# Patient Record
Sex: Male | Born: 1945 | Race: White | Hispanic: No | Smoking: Heavy tobacco smoker
Health system: Southern US, Community
[De-identification: ages and names within clinical notes are randomized; demographics above are authoritative.]

## PROBLEM LIST (undated history)

## (undated) DIAGNOSIS — IMO0001 Reserved for inherently not codable concepts without codable children: Secondary | ICD-10-CM

## (undated) DIAGNOSIS — J449 Chronic obstructive pulmonary disease, unspecified: Secondary | ICD-10-CM

## (undated) DIAGNOSIS — Z5111 Encounter for antineoplastic chemotherapy: Secondary | ICD-10-CM

## (undated) DIAGNOSIS — F329 Major depressive disorder, single episode, unspecified: Secondary | ICD-10-CM

## (undated) DIAGNOSIS — F32A Depression, unspecified: Secondary | ICD-10-CM

## (undated) DIAGNOSIS — I10 Essential (primary) hypertension: Secondary | ICD-10-CM

## (undated) DIAGNOSIS — R911 Solitary pulmonary nodule: Secondary | ICD-10-CM

## (undated) DIAGNOSIS — F419 Anxiety disorder, unspecified: Secondary | ICD-10-CM

## (undated) DIAGNOSIS — H269 Unspecified cataract: Secondary | ICD-10-CM

## (undated) HISTORY — DX: Major depressive disorder, single episode, unspecified: F32.9

## (undated) HISTORY — PX: APPENDECTOMY: SHX54

## (undated) HISTORY — DX: Chronic obstructive pulmonary disease, unspecified: J44.9

## (undated) HISTORY — PX: EYE SURGERY: SHX253

## (undated) HISTORY — DX: Depression, unspecified: F32.A

## (undated) HISTORY — DX: Essential (primary) hypertension: I10

## (undated) HISTORY — DX: Encounter for antineoplastic chemotherapy: Z51.11

---

## 2015-08-08 ENCOUNTER — Other Ambulatory Visit (HOSPITAL_COMMUNITY): Payer: Self-pay | Admitting: Nurse Practitioner

## 2015-08-08 DIAGNOSIS — J449 Chronic obstructive pulmonary disease, unspecified: Secondary | ICD-10-CM

## 2015-08-15 ENCOUNTER — Ambulatory Visit (HOSPITAL_COMMUNITY): Admission: RE | Admit: 2015-08-15 | Payer: Medicare PPO | Source: Ambulatory Visit

## 2015-08-22 ENCOUNTER — Ambulatory Visit (HOSPITAL_COMMUNITY): Payer: Medicare PPO

## 2015-08-30 ENCOUNTER — Other Ambulatory Visit (HOSPITAL_COMMUNITY): Payer: Self-pay | Admitting: Nurse Practitioner

## 2015-08-30 DIAGNOSIS — J449 Chronic obstructive pulmonary disease, unspecified: Secondary | ICD-10-CM

## 2015-08-31 ENCOUNTER — Encounter (HOSPITAL_COMMUNITY): Payer: Self-pay

## 2015-08-31 ENCOUNTER — Ambulatory Visit (HOSPITAL_COMMUNITY)
Admission: RE | Admit: 2015-08-31 | Discharge: 2015-08-31 | Disposition: A | Discharge status: Home / Self Care | Payer: Medicare PPO | Source: Ambulatory Visit | Attending: Nurse Practitioner | Admitting: Nurse Practitioner

## 2015-08-31 DIAGNOSIS — R911 Solitary pulmonary nodule: Secondary | ICD-10-CM | POA: Diagnosis not present

## 2015-08-31 DIAGNOSIS — J439 Emphysema, unspecified: Secondary | ICD-10-CM | POA: Insufficient documentation

## 2015-08-31 DIAGNOSIS — J449 Chronic obstructive pulmonary disease, unspecified: Secondary | ICD-10-CM | POA: Insufficient documentation

## 2015-09-05 ENCOUNTER — Telehealth: Payer: Self-pay | Admitting: *Deleted

## 2015-09-05 NOTE — Telephone Encounter (Signed)
Oncology Nurse Navigator Documentation  Oncology Nurse Navigator Flowsheets 09/05/2015  Navigator Encounter Type Telephone  Telephone Outgoing Call  Abnormal Finding Date 08/31/2015  Treatment Phase Pre-Tx/Tx Discussion  Barriers/Navigation Needs Coordination of Care  Interventions Coordination of Care  Coordination of Care Appts  Acuity Level 1  Acuity Level 1 Initial guidance, education and coordination as needed  Time Spent with Patient 15   I received referral on [facility] today.  I called patient but was unable to reach or leave a message.  I will call back.  I also asked HIM dept to obtain more records.

## 2015-09-08 ENCOUNTER — Encounter: Payer: Self-pay | Admitting: *Deleted

## 2015-09-08 ENCOUNTER — Telehealth: Payer: Self-pay | Admitting: *Deleted

## 2015-09-08 DIAGNOSIS — R918 Other nonspecific abnormal finding of lung field: Secondary | ICD-10-CM | POA: Insufficient documentation

## 2015-09-08 NOTE — Telephone Encounter (Signed)
Oncology Nurse Navigator Documentation  Oncology Nurse Navigator Flowsheets 09/08/2015  Navigator Encounter Type Telephone  Telephone Outgoing Call  Abnormal Finding Date -  Treatment Phase Pre-Tx/Tx Discussion  Barriers/Navigation Needs Coordination of Care  Interventions Coordination of Care  Coordination of Care EUS  Acuity Level 2  Acuity Level 1 -  Time Spent with Patient 58   I received referral and was able to reach [facility] today.  I gave the transportation dept information on appt to see Dr. Julien Nordmann on 09/13/15 arrive at 1:45.  I called PCP for records

## 2015-09-08 NOTE — Progress Notes (Signed)
Oncology Nurse Navigator Documentation  Oncology Nurse Navigator Flowsheets 09/08/2015  Navigator Encounter Type Telephone;Other  Telephone Outgoing Call  Abnormal Finding Date -  Treatment Phase Pre-Tx/Tx Discussion  Barriers/Navigation Needs Coordination of Care  Interventions Coordination of Care  Coordination of Care Other  Acuity Level 2  Time Spent with Patient 15   Called referring office twice today to obtain records.  I was unable to reach nor leave a vm message.

## 2015-09-09 ENCOUNTER — Telehealth: Payer: Self-pay | Admitting: Internal Medicine

## 2015-09-09 ENCOUNTER — Encounter: Payer: Self-pay | Admitting: *Deleted

## 2015-09-09 NOTE — Telephone Encounter (Signed)
Wells Guiles from nursing home will obtain authorization for pt

## 2015-09-09 NOTE — Progress Notes (Signed)
Oncology Nurse Navigator Documentation  Oncology Nurse Navigator Flowsheets 09/09/2015  Navigator Encounter Type Telephone  Telephone Outgoing Call  Treatment Phase Pre-Tx/Tx Discussion  Barriers/Navigation Needs Coordination of Care  Interventions Coordination of Care  Coordination of Care Other  Acuity Level 2  Time Spent with Patient 15   I called referring office today.  I called several phone numbers but have not been able to reach office.  I am unable to leave a vm message.

## 2015-09-13 ENCOUNTER — Ambulatory Visit (HOSPITAL_BASED_OUTPATIENT_CLINIC_OR_DEPARTMENT_OTHER): Payer: Medicare PPO | Admitting: Internal Medicine

## 2015-09-13 ENCOUNTER — Other Ambulatory Visit (HOSPITAL_BASED_OUTPATIENT_CLINIC_OR_DEPARTMENT_OTHER): Payer: Medicare PPO

## 2015-09-13 ENCOUNTER — Encounter: Payer: Self-pay | Admitting: Internal Medicine

## 2015-09-13 ENCOUNTER — Telehealth: Payer: Self-pay | Admitting: Internal Medicine

## 2015-09-13 VITALS — BP 111/67 | HR 80 | Temp 98.6°F | Resp 18 | Ht 68.0 in | Wt 175.7 lb

## 2015-09-13 DIAGNOSIS — Z720 Tobacco use: Secondary | ICD-10-CM | POA: Diagnosis not present

## 2015-09-13 DIAGNOSIS — R0609 Other forms of dyspnea: Secondary | ICD-10-CM | POA: Diagnosis not present

## 2015-09-13 DIAGNOSIS — R918 Other nonspecific abnormal finding of lung field: Secondary | ICD-10-CM

## 2015-09-13 DIAGNOSIS — Z8042 Family history of malignant neoplasm of prostate: Secondary | ICD-10-CM

## 2015-09-13 DIAGNOSIS — R05 Cough: Secondary | ICD-10-CM | POA: Diagnosis not present

## 2015-09-13 LAB — CBC WITH DIFFERENTIAL/PLATELET
BASO%: 0.5 % (ref 0.0–2.0)
BASOS ABS: 0 10*3/uL (ref 0.0–0.1)
EOS%: 2 % (ref 0.0–7.0)
Eosinophils Absolute: 0.2 10*3/uL (ref 0.0–0.5)
HCT: 45.5 % (ref 38.4–49.9)
HGB: 15.7 g/dL (ref 13.0–17.1)
LYMPH%: 13 % — AB (ref 14.0–49.0)
MCH: 29.7 pg (ref 27.2–33.4)
MCHC: 34.5 g/dL (ref 32.0–36.0)
MCV: 86 fL (ref 79.3–98.0)
MONO#: 0.4 10*3/uL (ref 0.1–0.9)
MONO%: 4.8 % (ref 0.0–14.0)
NEUT#: 6.4 10*3/uL (ref 1.5–6.5)
NEUT%: 79.7 % — AB (ref 39.0–75.0)
Platelets: 200 10*3/uL (ref 140–400)
RBC: 5.29 10*6/uL (ref 4.20–5.82)
RDW: 14.6 % (ref 11.0–14.6)
WBC: 8.1 10*3/uL (ref 4.0–10.3)
lymph#: 1.1 10*3/uL (ref 0.9–3.3)
nRBC: 0 % (ref 0–0)

## 2015-09-13 LAB — COMPREHENSIVE METABOLIC PANEL
ALK PHOS: 83 U/L (ref 40–150)
ALT: 12 U/L (ref 0–55)
ANION GAP: 8 meq/L (ref 3–11)
AST: 10 U/L (ref 5–34)
Albumin: 3.5 g/dL (ref 3.5–5.0)
BUN: 13.1 mg/dL (ref 7.0–26.0)
CO2: 29 meq/L (ref 22–29)
Calcium: 9.3 mg/dL (ref 8.4–10.4)
Chloride: 101 mEq/L (ref 98–109)
Creatinine: 1.4 mg/dL — ABNORMAL HIGH (ref 0.7–1.3)
EGFR: 53 mL/min/{1.73_m2} — AB (ref >90)
GLUCOSE: 125 mg/dL (ref 70–140)
POTASSIUM: 4.7 meq/L (ref 3.5–5.1)
SODIUM: 137 meq/L (ref 136–145)
Total Bilirubin: 0.49 mg/dL (ref 0.20–1.20)
Total Protein: 6.8 g/dL (ref 6.4–8.3)

## 2015-09-13 NOTE — Telephone Encounter (Signed)
Gave pt appt & avs °

## 2015-09-13 NOTE — Progress Notes (Signed)
[facility] [phone]   [phone]  CONSULT NOTE  REFERRING PHYSICIAN: Shelba Flake, NP   REASON FOR CONSULTATION:  70 years old white male with questionable lung cancer.  HPI [facility] is a 70 y.o. male with past medical history significant for COPD, depression and hypertension as well as long history of smoking. The patient is currently a resident of his skilled nursing facility for unclear reason. He was seen at one of the urgent care centers recently complaining of shortness of breath and cough. Chest x-ray at that time showed questionable nodule in the left lung. This was followed by CT scan of the chest without contrast on 08/31/2015 and it showed a spiculated nodule measuring 2.5 x 2.8 x 2.8 cm which extend and crosses through the oblique fissure. There was also a prevascular lymph node adjacent to the transverse or aortic measuring 11 mm and a subcarinal lymph node measuring 11 mm. No axillary or supraclavicular adenopathy. There was also small low density structure within the left lobe of the liver which is too small to characterize measuring 7 mm. The patient was referred to me today for further evaluation and recommendation regarding these abnormalities in his lung. When seen today he is feeling fine with no specific complaints except for shortness breath at baseline and increased with exertion and dry cough. He denied having any significant chest pain or hemoptysis. He has no nausea, vomiting or diarrhea but has constipation. He also has intermittent headache and blurry vision. The patient denied having any significant weight loss or night sweats. Family history significant for father with prostate cancer and mother had COPD. The patient is a widow and has 4 children. He lives by himself in [place] and he has no connection with his children. He was accompanied by a friend, [place]. He used to work in [employer]. He has a history of smoking  1-2 packs per day for around 53 years and he currently smoke one cigarette every day. He denied any history of alcohol or drug abuse.    HPI  Past Medical History  Diagnosis Date  . HTN (hypertension)   . COPD (chronic obstructive pulmonary disease) ([place])   . Depression     History reviewed. No pertinent past surgical history.  Family History  Problem Relation Age of Onset  . COPD Mother   . Cancer Father     Social History Social History  Substance Use Topics  . Smoking status: Heavy Tobacco Smoker -- 2.00 packs/day for 15 years    Types: Cigarettes  . Smokeless tobacco: None  . Alcohol Use: No    Not on File  Current Outpatient Prescriptions  Medication Sig Dispense Refill  . acetaminophen (TYLENOL) 325 MG tablet Take 650 mg by mouth.    Marland Kitchen albuterol (PROAIR HFA) 108 (90 Base) MCG/ACT inhaler Inhale 2 puffs into the lungs every 4 (four) hours as needed.    Marland Kitchen amLODipine (NORVASC) 5 MG tablet Take 5 mg by mouth.    Marland Kitchen buPROPion (WELLBUTRIN SR) 150 MG 12 hr tablet Take 150 mg by mouth.    . hydrOXYzine (ATARAX/VISTARIL) 25 MG tablet Take 25 mg by mouth.    . risperiDONE (RISPERDAL) 2 MG tablet Take 2 mg by mouth.    . traZODone (DESYREL) 150 MG tablet Take 150 mg by mouth.     No current facility-administered medications for this visit.    Review of Systems  Constitutional: positive for fatigue Eyes: negative Ears, nose,  mouth, throat, and face: negative Respiratory: positive for cough, dyspnea on exertion and wheezing Cardiovascular: negative Gastrointestinal: negative Genitourinary:negative Integument/breast: negative Hematologic/lymphatic: negative Musculoskeletal:negative Neurological: negative Behavioral/Psych: negative Endocrine: negative Allergic/Immunologic: negative  Physical Exam  VOH:YWVPX, healthy, no distress, well nourished and well developed SKIN: skin color, texture, turgor are normal, no rashes or significant lesions HEAD: Normocephalic,  No masses, lesions, tenderness or abnormalities EYES: normal, PERRLA, Conjunctiva are pink and non-injected EARS: External ears normal, Canals clear OROPHARYNX:no exudate, no erythema and lips, buccal mucosa, and tongue normal  NECK: supple, no adenopathy, no JVD LYMPH:  no palpable lymphadenopathy, no hepatosplenomegaly LUNGS: prolonged expiratory phase, expiratory wheezes bilaterally HEART: regular rate & rhythm, no murmurs and no gallops ABDOMEN:abdomen soft, non-tender, normal bowel sounds and no masses or organomegaly BACK: Back symmetric, no curvature., No CVA tenderness EXTREMITIES:no joint deformities, effusion, or inflammation, no edema, no skin discoloration  NEURO: alert & oriented x 3 with fluent speech, no focal motor/sensory deficits  PERFORMANCE STATUS: ECOG   LABORATORY DATA: Lab Results  Component Value Date   WBC 8.1 09/13/2015   HGB 15.7 09/13/2015   HCT 45.5 09/13/2015   MCV 86.0 09/13/2015   PLT 200 09/13/2015      Chemistry      Component Value Date/Time   NA 137 09/13/2015 1340   K 4.7 09/13/2015 1340   CO2 29 09/13/2015 1340   BUN 13.1 09/13/2015 1340   CREATININE 1.4* 09/13/2015 1340      Component Value Date/Time   CALCIUM 9.3 09/13/2015 1340   ALKPHOS 83 09/13/2015 1340   AST 10 09/13/2015 1340   ALT 12 09/13/2015 1340   BILITOT 0.49 09/13/2015 1340       RADIOGRAPHIC STUDIES: Ct Chest Wo Contrast  08/31/2015  CLINICAL DATA:  Increased shortness of breath for 6 months. EXAM: CT CHEST WITHOUT CONTRAST TECHNIQUE: Multidetector CT imaging of the chest was performed following the standard protocol without IV contrast. COMPARISON:  None. FINDINGS: Mediastinum/Lymph Nodes: Heart size is normal. Aortic atherosclerosis noted. Calcification within the RCA, LAD and left circumflex coronary artery noted. There is a prevascular lymph node adjacent to the transverse or aortic arch measuring 11 mm, image 36 of series 2. Sub- carinal lymph node is prominent  measuring 11 mm, image 45 of series 2. No axillary or supraclavicular adenopathy. Lungs/Pleura: No pleural fluid. Moderate changes of centrilobular and paraseptal emphysema identified. Diffuse bronchial wall thickening noted. Within the superior segment of the left lower lobe there is a spiculated nodule which extends and crosses through the oblique fissure, image 58 of series 5. This measures 2.5 x 2.8 by 2.8 cm. There are dependent changes identified within the right middle lobe and right lower lobe. Small nonspecific subpleural nodule within the posterior left apex measures 8 mm. Upper abdomen: The adrenal glands are normal. There is a small low density structure within the left lobe of liver which is too small to characterize measuring 7 mm. No acute findings identified within the upper abdomen. Musculoskeletal: No aggressive lytic or sclerotic bone lesions identified. IMPRESSION: 1. Spiculated nodule in the superior segment of the left lower lobe is highly worrisome for primary bronchogenic carcinoma. Further evaluation with PET-CT and tissue sampling (if indicated) is recommended. 2. Diffuse bronchial wall thickening with emphysema, as above; imaging findings suggestive of underlying COPD. 3. These results will be called to the ordering clinician or representative by the Radiologist Assistant, and communication documented in the PACS or zVision Dashboard. Electronically Signed   By: Lovena Le  Clovis Riley M.D.   On: 08/31/2015 16:32    ASSESSMENT: This is a very pleasant 70 years old white male with left lower lobe lung nodule in addition to suspicious mediastinal lymphadenopathy concerning for primary bronchogenic carcinoma.   PLAN: I had a lengthy discussion with the patient and his caregiver today about his current condition and further investigation to confirm diagnosis. This is highly suspicious for his stage III lung cancer. I recommended for the patient to have a PET scan performed for further evaluation  of his disease. If the PET scan showed no evidence for metastatic disease in the mediastinum or outside the chest, I would consider referring the patient to cardiothoracic surgery for evaluation of surgical resection after repeating pulmonary function test. If the mediastinal lymph nodes are concerning for metastatic disease, which may consider the patient for CT-guided core biopsy of the left lower lobe lung mass. The patient would come back for follow-up visit in 2 weeks for further evaluation and recommendation regarding his condition based on the PET scan results. He was advised to call immediately if he has any concerning symptoms in the interval.  The patient voices understanding of current disease status and treatment options and is in agreement with the current care plan.  All questions were answered. The patient knows to call the clinic with any problems, questions or concerns. We can certainly see the patient much sooner if necessary.  Thank you so much for allowing me to participate in the care of [facility]. I will continue to follow up the patient with you and assist in his care.  I spent 40 minutes counseling the patient face to face. The total time spent in the appointment was 60 minutes.  Disclaimer: This note was dictated with voice recognition software. Similar sounding words can inadvertently be transcribed and may not be corrected upon review.   Tearah Saulsbury K. September 13, 2015, 3:23 PM

## 2015-09-20 ENCOUNTER — Encounter (HOSPITAL_COMMUNITY): Payer: Medicare PPO

## 2015-09-26 ENCOUNTER — Ambulatory Visit (HOSPITAL_COMMUNITY)
Admission: RE | Admit: 2015-09-26 | Discharge: 2015-09-26 | Disposition: A | Discharge status: Home / Self Care | Payer: Medicare PPO | Source: Ambulatory Visit | Attending: Internal Medicine | Admitting: Internal Medicine

## 2015-09-26 DIAGNOSIS — R911 Solitary pulmonary nodule: Secondary | ICD-10-CM | POA: Insufficient documentation

## 2015-09-26 DIAGNOSIS — R590 Localized enlarged lymph nodes: Secondary | ICD-10-CM | POA: Insufficient documentation

## 2015-09-26 DIAGNOSIS — J850 Gangrene and necrosis of lung: Secondary | ICD-10-CM | POA: Diagnosis not present

## 2015-09-26 DIAGNOSIS — R918 Other nonspecific abnormal finding of lung field: Secondary | ICD-10-CM | POA: Insufficient documentation

## 2015-09-26 LAB — GLUCOSE, CAPILLARY: GLUCOSE-CAPILLARY: 69 mg/dL (ref 65–99)

## 2015-09-26 MED ORDER — FLUDEOXYGLUCOSE F - 18 (FDG) INJECTION
9.2000 | Freq: Once | INTRAVENOUS | Status: AC | PRN
  Administered 2015-09-26: 9.2 via INTRAVENOUS

## 2015-09-29 ENCOUNTER — Encounter: Payer: Self-pay | Admitting: Internal Medicine

## 2015-09-29 ENCOUNTER — Encounter: Payer: Self-pay | Admitting: *Deleted

## 2015-09-29 ENCOUNTER — Telehealth: Payer: Self-pay | Admitting: Internal Medicine

## 2015-09-29 ENCOUNTER — Ambulatory Visit (HOSPITAL_BASED_OUTPATIENT_CLINIC_OR_DEPARTMENT_OTHER): Payer: Medicare PPO | Admitting: Internal Medicine

## 2015-09-29 VITALS — BP 126/53 | HR 78 | Temp 97.8°F | Resp 18 | Ht 68.0 in | Wt 178.7 lb

## 2015-09-29 DIAGNOSIS — R918 Other nonspecific abnormal finding of lung field: Secondary | ICD-10-CM | POA: Diagnosis not present

## 2015-09-29 DIAGNOSIS — R109 Unspecified abdominal pain: Secondary | ICD-10-CM | POA: Diagnosis not present

## 2015-09-29 NOTE — Progress Notes (Signed)
Oncology Nurse Navigator Documentation  Oncology Nurse Navigator Flowsheets 09/29/2015  Navigator Encounter Type Clinic/MDC  Patient Visit Type MedOnc  Treatment Phase Pre-Tx/Tx Discussion  Barriers/Navigation Needs Coordination of Care  Interventions Coordination of Care  Coordination of Care Appts  Acuity Level 2  Time Spent with Patient 80   Spoke with patient and friend today at [facility].  He needs assistance with arranging appt's.  He needs MRI brain and CT biopsy, will arrange and follow up with patient.

## 2015-09-29 NOTE — Patient Instructions (Signed)
Smoking Cessation, Tips for Success If you are ready to quit smoking, congratulations! You have chosen to help yourself be healthier. Cigarettes bring nicotine, tar, carbon monoxide, and other irritants into your body. Your lungs, heart, and blood vessels will be able to work better without these poisons. There are many different ways to quit smoking. Nicotine gum, nicotine patches, a nicotine inhaler, or nicotine nasal spray can help with physical craving. Hypnosis, support groups, and medicines help break the habit of smoking. WHAT THINGS CAN I DO TO MAKE QUITTING EASIER?  Here are some tips to help you quit for good:  Pick a date when you will quit smoking completely. Tell all of your friends and family about your plan to quit on that date.  Do not try to slowly cut down on the number of cigarettes you are smoking. Pick a quit date and quit smoking completely starting on that day.  Throw away all cigarettes.   Clean and remove all ashtrays from your home, work, and car.  On a card, write down your reasons for quitting. Carry the card with you and read it when you get the urge to smoke.  Cleanse your body of nicotine. Drink enough water and fluids to keep your urine clear or pale yellow. Do this after quitting to flush the nicotine from your body.  Learn to predict your moods. Do not let a bad situation be your excuse to have a cigarette. Some situations in your life might tempt you into wanting a cigarette.  Never have "just one" cigarette. It leads to wanting another and another. Remind yourself of your decision to quit.  Change habits associated with smoking. If you smoked while driving or when feeling stressed, try other activities to replace smoking. Stand up when drinking your coffee. Brush your teeth after eating. Sit in a different chair when you read the paper. Avoid alcohol while trying to quit, and try to drink fewer caffeinated beverages. Alcohol and caffeine may urge you to  smoke.  Avoid foods and drinks that can trigger a desire to smoke, such as sugary or spicy foods and alcohol.  Ask people who smoke not to smoke around you.  Have something planned to do right after eating or having a cup of coffee. For example, plan to take a walk or exercise.  Try a relaxation exercise to calm you down and decrease your stress. Remember, you may be tense and nervous for the first 2 weeks after you quit, but this will pass.  Find new activities to keep your hands busy. Play with a pen, coin, or rubber band. Doodle or draw things on paper.  Brush your teeth right after eating. This will help cut down on the craving for the taste of tobacco after meals. You can also try mouthwash.   Use oral substitutes in place of cigarettes. Try using lemon drops, carrots, cinnamon sticks, or chewing gum. Keep them handy so they are available when you have the urge to smoke.  When you have the urge to smoke, try deep breathing.  Designate your home as a nonsmoking area.  If you are a heavy smoker, ask your health care provider about a prescription for nicotine chewing gum. It can ease your withdrawal from nicotine.  Reward yourself. Set aside the cigarette money you save and buy yourself something nice.  Look for support from others. Join a support group or smoking cessation program. Ask someone at home or at work to help you with your plan   to quit smoking.  Always ask yourself, "Do I need this cigarette or is this just a reflex?" Tell yourself, "Today, I choose not to smoke," or "I do not want to smoke." You are reminding yourself of your decision to quit.  Do not replace cigarette smoking with electronic cigarettes (commonly called e-cigarettes). The safety of e-cigarettes is unknown, and some may contain harmful chemicals.  If you relapse, do not give up! Plan ahead and think about what you will do the next time you get the urge to smoke. HOW WILL I FEEL WHEN I QUIT SMOKING? You  may have symptoms of withdrawal because your body is used to nicotine (the addictive substance in cigarettes). You may crave cigarettes, be irritable, feel very hungry, cough often, get headaches, or have difficulty concentrating. The withdrawal symptoms are only temporary. They are strongest when you first quit but will go away within 10-14 days. When withdrawal symptoms occur, stay in control. Think about your reasons for quitting. Remind yourself that these are signs that your body is healing and getting used to being without cigarettes. Remember that withdrawal symptoms are easier to treat than the major diseases that smoking can cause.  Even after the withdrawal is over, expect periodic urges to smoke. However, these cravings are generally short lived and will go away whether you smoke or not. Do not smoke! WHAT RESOURCES ARE AVAILABLE TO HELP ME QUIT SMOKING? Your health care provider can direct you to community resources or hospitals for support, which may include:  Group support.  Education.  Hypnosis.  Therapy.   This information is not intended to replace advice given to you by your health care provider. Make sure you discuss any questions you have with your health care provider.   Document Released: 02/03/2004 Document Revised: 05/28/2014 Document Reviewed: 10/23/2012 Elsevier Interactive Patient Education 2016 [employer]  

## 2015-09-29 NOTE — Telephone Encounter (Signed)
Gave pt apt & avs °

## 2015-09-29 NOTE — Progress Notes (Signed)
[facility] [phone]   Fax:([phone]) [phone]  OFFICE PROGRESS NOTE  Pcp Not In System No address on file  DIAGNOSIS: Questionable stage IIIa (T1b, N2, M0) non-small cell lung cancer, pending tissue diagnosis  PRIOR THERAPY: None  CURRENT THERAPY: None  INTERVAL HISTORY: Christian Espinoza 70 y.o. male returns to the clinic today for follow-up visit accompanied by his daughter. The patient is feeling fine today with no specific complaints except for occasional abdominal pain. He denied having any significant chest pain, shortness of breath but has mild cough with no hemoptysis. He has no significant weight loss or night sweats. He has no fever or chills, no nausea or vomiting. He had a PET scan performed recently and he is here for evaluation and discussion of his PET scan results.  MEDICAL HISTORY: Past Medical History  Diagnosis Date  . HTN (hypertension)   . COPD (chronic obstructive pulmonary disease) ([place])   . Depression     ALLERGIES:  has no allergies on file.  MEDICATIONS:  Current Outpatient Prescriptions  Medication Sig Dispense Refill  . acetaminophen (TYLENOL) 325 MG tablet Take 650 mg by mouth.    Marland Kitchen albuterol (PROAIR HFA) 108 (90 Base) MCG/ACT inhaler Inhale 2 puffs into the lungs every 4 (four) hours as needed.    Marland Kitchen amLODipine (NORVASC) 5 MG tablet Take 5 mg by mouth.    Marland Kitchen buPROPion (WELLBUTRIN SR) 150 MG 12 hr tablet Take 150 mg by mouth.    . hydrOXYzine (ATARAX/VISTARIL) 25 MG tablet Take 25 mg by mouth.    . risperiDONE (RISPERDAL) 2 MG tablet Take 2 mg by mouth.    . traZODone (DESYREL) 150 MG tablet Take 150 mg by mouth.     No current facility-administered medications for this visit.    SURGICAL HISTORY: History reviewed. No pertinent past surgical history.  REVIEW OF SYSTEMS:  A comprehensive review of systems was negative.   PHYSICAL EXAMINATION: General appearance: alert, cooperative and no distress Head: Normocephalic,  without obvious abnormality, atraumatic Neck: no adenopathy, no JVD, supple, symmetrical, trachea midline and thyroid not enlarged, symmetric, no tenderness/mass/nodules Lymph nodes: Cervical, supraclavicular, and axillary nodes normal. Resp: clear to auscultation bilaterally Back: symmetric, no curvature. ROM normal. No CVA tenderness. Cardio: regular rate and rhythm, S1, S2 normal, no murmur, click, rub or gallop GI: soft, non-tender; bowel sounds normal; no masses,  no organomegaly Extremities: extremities normal, atraumatic, no cyanosis or edema  ECOG PERFORMANCE STATUS: 1 - Symptomatic but completely ambulatory  Blood pressure 126/53, pulse 78, temperature 97.8 F (36.6 C), temperature source Oral, resp. rate 18, height '5\' 8"'$  (1.727 m), weight 178 lb 11.2 oz (81.058 kg), SpO2 92 %.  LABORATORY DATA: Lab Results  Component Value Date   WBC 8.1 09/13/2015   HGB 15.7 09/13/2015   HCT 45.5 09/13/2015   MCV 86.0 09/13/2015   PLT 200 09/13/2015      Chemistry      Component Value Date/Time   NA 137 09/13/2015 1340   K 4.7 09/13/2015 1340   CO2 29 09/13/2015 1340   BUN 13.1 09/13/2015 1340   CREATININE 1.4* 09/13/2015 1340      Component Value Date/Time   CALCIUM 9.3 09/13/2015 1340   ALKPHOS 83 09/13/2015 1340   AST 10 09/13/2015 1340   ALT 12 09/13/2015 1340   BILITOT 0.49 09/13/2015 1340       RADIOGRAPHIC STUDIES: Ct Chest Wo Contrast  08/31/2015  CLINICAL DATA:  Increased shortness  of breath for 6 months. EXAM: CT CHEST WITHOUT CONTRAST TECHNIQUE: Multidetector CT imaging of the chest was performed following the standard protocol without IV contrast. COMPARISON:  None. FINDINGS: Mediastinum/Lymph Nodes: Heart size is normal. Aortic atherosclerosis noted. Calcification within the RCA, LAD and left circumflex coronary artery noted. There is a prevascular lymph node adjacent to the transverse or aortic arch measuring 11 mm, image 36 of series 2. Sub- carinal lymph node is  prominent measuring 11 mm, image 45 of series 2. No axillary or supraclavicular adenopathy. Lungs/Pleura: No pleural fluid. Moderate changes of centrilobular and paraseptal emphysema identified. Diffuse bronchial wall thickening noted. Within the superior segment of the left lower lobe there is a spiculated nodule which extends and crosses through the oblique fissure, image 58 of series 5. This measures 2.5 x 2.8 by 2.8 cm. There are dependent changes identified within the right middle lobe and right lower lobe. Small nonspecific subpleural nodule within the posterior left apex measures 8 mm. Upper abdomen: The adrenal glands are normal. There is a small low density structure within the left lobe of liver which is too small to characterize measuring 7 mm. No acute findings identified within the upper abdomen. Musculoskeletal: No aggressive lytic or sclerotic bone lesions identified. IMPRESSION: 1. Spiculated nodule in the superior segment of the left lower lobe is highly worrisome for primary bronchogenic carcinoma. Further evaluation with PET-CT and tissue sampling (if indicated) is recommended. 2. Diffuse bronchial wall thickening with emphysema, as above; imaging findings suggestive of underlying COPD. 3. These results will be called to the ordering clinician or representative by the Radiologist Assistant, and communication documented in the PACS or zVision Dashboard. Electronically Signed   By: Kerby Moors M.D.   On: 08/31/2015 16:32   Nm Pet Image Initial (pi) Skull Base To Thigh  09/26/2015  CLINICAL DATA:  Initial treatment strategy for lung nodule. EXAM: NUCLEAR MEDICINE PET SKULL BASE TO THIGH TECHNIQUE: 9.2 mCi F-18 FDG was injected intravenously. Full-ring PET imaging was performed from the skull base to thigh after the radiotracer. CT data was obtained and used for attenuation correction and anatomic localization. FASTING BLOOD GLUCOSE:  Value: 69 mg/dl COMPARISON:  Chest CT 08/31/2015. FINDINGS:  NECK No hypermetabolic lymph nodes in the neck. CHEST 2.8 cm spiculated nodule in the superior segment left lower lobe, involving the major fissure is markedly hypermetabolic with SUV max = 70.6. Central photopenia in this nodule is compatible with necrosis. The 11 mm AP window lymph node measured on the previous study is now 12 mm in short axis and demonstrates hypermetabolic FDG accumulation with SUV max = 4.9. 11 mm subcarinal lymph node identified on the previous study is hypermetabolic today with SUV max = 4.2. There is hypermetabolism in the posterior left hilum although no lymph node can be identified at this location on today's CT scan without intravenous contrast material. Coronary artery calcification is evident. No evidence of pericardial effusion. ABDOMEN/PELVIS No abnormal hypermetabolic activity within the liver, pancreas, adrenal glands, or spleen. No hypermetabolic lymph nodes in the abdomen or pelvis. There is abdominal aortic atherosclerosis without aneurysm. No adrenal nodule or mass. Diverticular changes are noted in the left colon without diverticulitis. SKELETON Bilateral inguinal hernias contain only fat. No evidence for hypermetabolic bone lesion to suggest osseous metastatic disease. IMPRESSION: Markedly hypermetabolic 2.8 cm left lower lobe pulmonary nodule shows central necrosis. Imaging features are compatible with primary bronchogenic carcinoma. Hypermetabolic AP window and subcarinal lymph nodes suggest metastatic disease. There is focal hypermetabolism  in the left hilum although discrete lymph nodes are not evident on the CT scan without intravenous contrast material. Electronically Signed   By: Misty Stanley M.D.   On: 09/26/2015 15:54    ASSESSMENT AND PLAN: This is a very pleasant 70 years old white male was questionably stage IIIa non-small cell lung cancer based on the recent finding from the PET scan. I discussed the scan results with the patient and his daughter. I  recommended for him to have CT-guided core biopsy of the left lower lobe lung lesion by interventional radiology. I will also arrange for the patient to have MRI of the brain to rule out brain metastasis. We will arrange for the patient a follow-up visit at the multidisciplinary thoracic oncology clinic after his biopsy and MRI of the brain. He was advised to call immediately if he has any concerning symptoms in the interval. The patient voices understanding of current disease status and treatment options and is in agreement with the current care plan.  All questions were answered. The patient knows to call the clinic with any problems, questions or concerns. We can certainly see the patient much sooner if necessary.  Disclaimer: This note was dictated with voice recognition software. Similar sounding words can inadvertently be transcribed and may not be corrected upon review.

## 2015-09-30 ENCOUNTER — Telehealth: Payer: Self-pay | Admitting: *Deleted

## 2015-09-30 NOTE — Telephone Encounter (Signed)
Oncology Nurse Navigator Documentation  Oncology Nurse Navigator Flowsheets 09/30/2015  Navigator Encounter Type Telephone  Telephone Outgoing Call  Treatment Phase Pre-Tx/Tx Discussion  Barriers/Navigation Needs Coordination of Care  Interventions Coordination of Care  Coordination of Care Appts  Acuity Level 1  Time Spent with Patient 30   Patient's MRI Aaron Edelman and biopsy are set up for next week.  Per Dr. Julien Nordmann I scheduled patient for [place] on 10/13/15.  I called patient's facility and updated them on appt.

## 2015-10-04 ENCOUNTER — Ambulatory Visit (HOSPITAL_COMMUNITY)
Admission: RE | Admit: 2015-10-04 | Discharge: 2015-10-04 | Disposition: A | Discharge status: Home / Self Care | Payer: Medicare PPO | Source: Ambulatory Visit | Attending: Internal Medicine | Admitting: Internal Medicine

## 2015-10-04 DIAGNOSIS — R918 Other nonspecific abnormal finding of lung field: Secondary | ICD-10-CM | POA: Diagnosis not present

## 2015-10-04 MED ORDER — GADOBENATE DIMEGLUMINE 529 MG/ML IV SOLN
20.0000 mL | Freq: Once | INTRAVENOUS | Status: AC | PRN
  Administered 2015-10-04: 16 mL via INTRAVENOUS

## 2015-10-05 ENCOUNTER — Other Ambulatory Visit: Payer: Self-pay | Admitting: Radiology

## 2015-10-06 ENCOUNTER — Ambulatory Visit (HOSPITAL_COMMUNITY)
Admission: RE | Admit: 2015-10-06 | Discharge: 2015-10-06 | Disposition: A | Discharge status: Home / Self Care | Payer: Medicare PPO | Source: Ambulatory Visit | Attending: Internal Medicine | Admitting: Internal Medicine

## 2015-10-06 ENCOUNTER — Ambulatory Visit (HOSPITAL_COMMUNITY)
Admission: RE | Admit: 2015-10-06 | Discharge: 2015-10-06 | Disposition: A | Discharge status: Home / Self Care | Payer: Medicare PPO | Source: Ambulatory Visit | Attending: Diagnostic Radiology | Admitting: Diagnostic Radiology

## 2015-10-06 DIAGNOSIS — Z9889 Other specified postprocedural states: Secondary | ICD-10-CM

## 2015-10-06 DIAGNOSIS — I10 Essential (primary) hypertension: Secondary | ICD-10-CM | POA: Diagnosis not present

## 2015-10-06 DIAGNOSIS — J449 Chronic obstructive pulmonary disease, unspecified: Secondary | ICD-10-CM | POA: Diagnosis not present

## 2015-10-06 DIAGNOSIS — Z825 Family history of asthma and other chronic lower respiratory diseases: Secondary | ICD-10-CM | POA: Diagnosis not present

## 2015-10-06 DIAGNOSIS — R918 Other nonspecific abnormal finding of lung field: Secondary | ICD-10-CM | POA: Insufficient documentation

## 2015-10-06 DIAGNOSIS — F329 Major depressive disorder, single episode, unspecified: Secondary | ICD-10-CM | POA: Insufficient documentation

## 2015-10-06 DIAGNOSIS — F17210 Nicotine dependence, cigarettes, uncomplicated: Secondary | ICD-10-CM | POA: Diagnosis not present

## 2015-10-06 DIAGNOSIS — Z79899 Other long term (current) drug therapy: Secondary | ICD-10-CM | POA: Diagnosis not present

## 2015-10-06 DIAGNOSIS — R072 Precordial pain: Secondary | ICD-10-CM | POA: Diagnosis not present

## 2015-10-06 LAB — CBC
HEMATOCRIT: 45.5 % (ref 39.0–52.0)
HEMOGLOBIN: 15.2 g/dL (ref 13.0–17.0)
MCH: 28.7 pg (ref 26.0–34.0)
MCHC: 33.4 g/dL (ref 30.0–36.0)
MCV: 85.8 fL (ref 78.0–100.0)
Platelets: 190 10*3/uL (ref 150–400)
RBC: 5.3 MIL/uL (ref 4.22–5.81)
RDW: 14.1 % (ref 11.5–15.5)
WBC: 9.4 10*3/uL (ref 4.0–10.5)

## 2015-10-06 LAB — PROTIME-INR
INR: 0.97 (ref 0.00–1.49)
Prothrombin Time: 13.1 seconds (ref 11.6–15.2)

## 2015-10-06 LAB — TROPONIN I: Troponin I: <0.03 ng/mL (ref <0.031)

## 2015-10-06 LAB — APTT: aPTT: 27 seconds (ref 24–37)

## 2015-10-06 MED ORDER — ALBUTEROL SULFATE (2.5 MG/3ML) 0.083% IN NEBU
2.5000 mg | INHALATION_SOLUTION | Freq: Once | RESPIRATORY_TRACT | Status: AC
  Administered 2015-10-06: 2.5 mg via RESPIRATORY_TRACT

## 2015-10-06 MED ORDER — SODIUM CHLORIDE 0.9 % IV SOLN: INTRAVENOUS | Status: DC

## 2015-10-06 MED ORDER — MIDAZOLAM HCL 2 MG/2ML IJ SOLN
INTRAMUSCULAR | Status: AC | PRN
  Administered 2015-10-06: 0.5 mg via INTRAVENOUS
  Administered 2015-10-06: 1 mg via INTRAVENOUS

## 2015-10-06 MED ORDER — NITROGLYCERIN 0.4 MG SL SUBL
SUBLINGUAL_TABLET | SUBLINGUAL | Status: AC
  Administered 2015-10-06: 0.4 mg via SUBLINGUAL
  Filled 2015-10-06: qty 3

## 2015-10-06 MED ORDER — MIDAZOLAM HCL 2 MG/2ML IJ SOLN
INTRAMUSCULAR | Status: AC
Start: 2015-10-06 — End: 2015-10-06
  Filled 2015-10-06: qty 4

## 2015-10-06 MED ORDER — LIDOCAINE HCL 1 % IJ SOLN
INTRAMUSCULAR | Status: AC
  Filled 2015-10-06: qty 20

## 2015-10-06 MED ORDER — ALBUTEROL SULFATE (2.5 MG/3ML) 0.083% IN NEBU
INHALATION_SOLUTION | RESPIRATORY_TRACT | Status: AC
  Administered 2015-10-06: 2.5 mg via RESPIRATORY_TRACT
  Filled 2015-10-06: qty 3

## 2015-10-06 MED ORDER — FENTANYL CITRATE (PF) 100 MCG/2ML IJ SOLN
INTRAMUSCULAR | Status: AC | PRN
  Administered 2015-10-06 (×2): 25 ug via INTRAVENOUS

## 2015-10-06 MED ORDER — NITROGLYCERIN 0.4 MG SL SUBL
0.4000 mg | SUBLINGUAL_TABLET | SUBLINGUAL | Status: DC | PRN
  Administered 2015-10-06 (×2): 0.4 mg via SUBLINGUAL

## 2015-10-06 MED ORDER — SODIUM CHLORIDE 0.9 % IV SOLN: Freq: Once | INTRAVENOUS | Status: DC

## 2015-10-06 MED ORDER — FENTANYL CITRATE (PF) 100 MCG/2ML IJ SOLN
INTRAMUSCULAR | Status: AC
  Filled 2015-10-06: qty 4

## 2015-10-06 MED ORDER — SODIUM CHLORIDE 0.9 % IV SOLN
INTRAVENOUS | Status: AC | PRN
  Administered 2015-10-06: 10 mL/h via INTRAVENOUS

## 2015-10-06 NOTE — Consult Note (Signed)
Chief Complaint: Patient was seen in consultation today for CT-guided left lung mass biopsy  Referring Physician(s): Christian Espinoza,Christian Espinoza  Supervising Physician: Christian Espinoza  Patient Status: Out-pt  History of Present Illness: Christian Espinoza is a 70 y.o. male smoker with past medical history of hypertension, COPD and depression. He recently underwent CT of chest on 08/31/15 secondary to increased shortness of breath which revealed a nodule in the superior segment left lower lobe worrisome for malignancy. Subsequent PET scan on 09/26/15 showed a markedly hypermetabolic 2.8 cm left lower lobe lung nodule with central necrosis. There were also hypermetabolic lymph nodes in the AP window and subcarinal region. He presents today for CT-guided left lung mass biopsy for further evaluation.  Past Medical History  Diagnosis Date  . HTN (hypertension)   . COPD (chronic obstructive pulmonary disease) ([place])   . Depression     No past surgical history on file.  Allergies: Review of patient's allergies indicates no known allergies.  Medications: Prior to Admission medications   Medication Sig Start Date End Date Taking? Authorizing Provider  acetaminophen (TYLENOL) 325 MG tablet Take 650 mg by mouth. 12/03/14   Historical Provider, MD  albuterol (PROAIR HFA) 108 (90 Base) MCG/ACT inhaler Inhale 2 puffs into the lungs every 4 (four) hours as needed for wheezing or shortness of breath.  11/18/14   Historical Provider, MD  amLODipine (NORVASC) 5 MG tablet Take 5 mg by mouth daily.  11/26/14 11/26/15  Historical Provider, MD  buPROPion (WELLBUTRIN SR) 150 MG 12 hr tablet Take 150 mg by mouth daily.  12/03/14 12/03/15  Historical Provider, MD  hydrOXYzine (ATARAX/VISTARIL) 25 MG tablet Take 25 mg by mouth. 12/03/14   Historical Provider, MD  risperiDONE (RISPERDAL) 2 MG tablet Take 2 mg by mouth. 12/03/14   Historical Provider, MD  traZODone (DESYREL) 150 MG tablet Take 150 mg by mouth. 11/18/14   Historical  Provider, MD     Family History  Problem Relation Age of Onset  . COPD Mother   . Cancer Father     Social History   Social History  . Marital Status: Widowed    Spouse Name: N/A  . Number of Children: N/A  . Years of Education: N/A   Social History Main Topics  . Smoking status: Heavy Tobacco Smoker -- 2.00 packs/day for 15 years    Types: Cigarettes  . Smokeless tobacco: Not on file  . Alcohol Use: No  . Drug Use: No  . Sexual Activity: Not on file   Other Topics Concern  . Not on file   Social History Narrative      Review of Systems he currently denies fever,  abdominal/back pain, nausea, vomiting, weight loss, night sweats or abnormal bleeding. He does have occasional headaches, dyspnea, and occasional cough; he also c/o brief episode of substernal CP following my visit today  Vital Signs: BP 116/91 mmHg  Pulse 67  Temp(Src) 97.8 F (36.6 C) (Oral)  Resp 16  Ht '5\' 8"'$  (1.727 m)  Wt 178 lb (80.74 kg)  BMI 27.07 kg/m2  SpO2 97%  Physical Exam patient awake, alert. Chest with distant breath sounds but a few expiratory wheezes. Heart with regular rate and rhythm. Abdomen obese, positive bowel sounds, nontender. Lower extremities with no edema.  Mallampati Score:  MD Evaluation Airway: WNL Heart: WNL Abdomen: WNL Chest/ Lungs: WNL ASA  Classification: 2 Mallampati/Airway Score: Two  Imaging: Mr Christian Espinoza Contrast  10/04/2015  CLINICAL DATA:  New diagnosis of  lung mass.  Staging. EXAM: MRI HEAD WITHOUT AND WITH CONTRAST TECHNIQUE: Multiplanar, multiecho pulse sequences of the brain and surrounding structures were obtained without and with intravenous contrast. CONTRAST:  27m MULTIHANCE GADOBENATE DIMEGLUMINE 529 MG/ML IV SOLN COMPARISON:  None. FINDINGS: Calvarium and upper cervical spine: Partly seen C5 ACDF hardware. No focal marrow signal abnormality. Orbits: Cataract resection bilaterally.  No signs of metastasis. Sinuses and Mastoids: Clear. Brain: No  evidence of metastasis. No acute or remote infarct, hemorrhage, hydrocephalus, or swelling. No evidence of large vessel occlusion. IMPRESSION: Negative for metastatic disease to the brain. Electronically Signed   By: Christian Espinoza.   On: 10/04/2015 09:06   Nm Pet Image Initial (pi) Skull Base To Thigh  09/26/2015  CLINICAL DATA:  Initial treatment strategy for lung nodule. EXAM: NUCLEAR MEDICINE PET SKULL BASE TO THIGH TECHNIQUE: 9.2 mCi F-18 FDG was injected intravenously. Full-ring PET imaging was performed from the skull base to thigh after the radiotracer. CT data was obtained and used for attenuation correction and anatomic localization. FASTING BLOOD GLUCOSE:  Value: 69 mg/dl COMPARISON:  Chest CT 08/31/2015. FINDINGS: NECK No hypermetabolic lymph nodes in the neck. CHEST 2.8 cm spiculated nodule in the superior segment left lower lobe, involving the major fissure is markedly hypermetabolic with SUV max = 146.2 Central photopenia in this nodule is compatible with necrosis. The 11 mm AP window lymph node measured on the previous study is now 12 mm in short axis and demonstrates hypermetabolic FDG accumulation with SUV max = 4.9. 11 mm subcarinal lymph node identified on the previous study is hypermetabolic today with SUV max = 4.2. There is hypermetabolism in the posterior left hilum although no lymph node can be identified at this location on today's CT scan without intravenous contrast material. Coronary artery calcification is evident. No evidence of pericardial effusion. ABDOMEN/PELVIS No abnormal hypermetabolic activity within the liver, pancreas, adrenal glands, or spleen. No hypermetabolic lymph nodes in the abdomen or pelvis. There is abdominal aortic atherosclerosis without aneurysm. No adrenal nodule or mass. Diverticular changes are noted in the left colon without diverticulitis. SKELETON Bilateral inguinal hernias contain only fat. No evidence for hypermetabolic bone lesion to suggest  osseous metastatic disease. IMPRESSION: Markedly hypermetabolic 2.8 cm left lower lobe pulmonary nodule shows central necrosis. Imaging features are compatible with primary bronchogenic carcinoma. Hypermetabolic AP window and subcarinal lymph nodes suggest metastatic disease. There is focal hypermetabolism in the left hilum although discrete lymph nodes are not evident on the CT scan without intravenous contrast material. Electronically Signed   By: EMisty StanleyM.D.   On: 09/26/2015 15:54    Labs:  CBC:  Recent Labs  09/13/15 1339 10/06/15 0945  WBC 8.1 9.4  HGB 15.7 15.2  HCT 45.5 45.5  PLT 200 190    COAGS: No results for input(s): INR, APTT in the last 8760 hours.  BMP:  Recent Labs  09/13/15 1340  NA 137  K 4.7  CO2 29  GLUCOSE 125  BUN 13.1  CALCIUM 9.3  CREATININE 1.4*    LIVER FUNCTION TESTS:  Recent Labs  09/13/15 1340  BILITOT 0.49  AST 10  ALT 12  ALKPHOS 83  PROT 6.8  ALBUMIN 3.5    TUMOR MARKERS: No results for input(s): AFPTM, CEA, CA199, CHROMGRNA in the last 8760 hours.  Assessment and Plan: Patient with history of tobacco abuse, COPD, dyspnea and hypermetabolic left lower lobe lung nodule on recent PET scan. He presents today for CT-guided left lung mass  biopsy for further evaluation. Pt did c/o brief episode substernal CP this am in [facility] during initial assessment without associated N/V , radiation or sweats. He was given SL NTG, EKG was unremarkable with NSR and also given albuterol nebulizer for some exp wheezes with resolution of pain; troponin pending.  Risks and benefits discussed with the patient including, but not limited to bleeding, infection, pneumothorax requiring chest tube ,damage to adjacent structures or low yield requiring additional tests and death. All of the patient's questions were answered, patient is agreeable to proceed pending further review by Dr. Anselm Pancoast. Consent signed and in chart.      Thank you for this interesting  consult.  I greatly enjoyed meeting Christian Espinoza and look forward to participating in their care.  A copy of this report was sent to the requesting provider on this date.  Electronically Signed: D. Rowe Robert 10/06/2015, 10:09 AM    I spent a total of  25 minutes in face to face in clinical consultation, greater than 50% of which was counseling/coordinating care for CT-guided left lung mass biopsy

## 2015-10-06 NOTE — Discharge Instructions (Signed)

## 2015-10-06 NOTE — Procedures (Addendum)
Patient's chest pain prior to procedure appeared to be self-limiting and may have been related to anxiety.  ECG and troponin were normal.  Therefore, we proceeded with CT guided lung biopsy.  CT guided core biopsies of left lung lesion. 2 cores obtained.  No immediate complication. Minimal blood loss. Plan for CXR in one hour.

## 2015-10-10 ENCOUNTER — Telehealth: Payer: Self-pay | Admitting: *Deleted

## 2015-10-10 NOTE — Telephone Encounter (Signed)
Mailed clinic letter to pt.  

## 2015-10-12 ENCOUNTER — Telehealth: Payer: Self-pay | Admitting: *Deleted

## 2015-10-12 ENCOUNTER — Other Ambulatory Visit: Payer: Self-pay | Admitting: Medical Oncology

## 2015-10-12 DIAGNOSIS — R918 Other nonspecific abnormal finding of lung field: Secondary | ICD-10-CM

## 2015-10-12 NOTE — Telephone Encounter (Signed)
Called living facility and confirmed 10/13/15 appt w/ them.

## 2015-10-13 ENCOUNTER — Encounter: Payer: Self-pay | Admitting: Internal Medicine

## 2015-10-13 ENCOUNTER — Encounter: Payer: Self-pay | Admitting: *Deleted

## 2015-10-13 ENCOUNTER — Ambulatory Visit (HOSPITAL_BASED_OUTPATIENT_CLINIC_OR_DEPARTMENT_OTHER): Payer: Medicare PPO | Admitting: Internal Medicine

## 2015-10-13 ENCOUNTER — Other Ambulatory Visit (HOSPITAL_BASED_OUTPATIENT_CLINIC_OR_DEPARTMENT_OTHER): Payer: Medicare PPO

## 2015-10-13 ENCOUNTER — Ambulatory Visit: Payer: Medicare PPO | Attending: Internal Medicine | Admitting: Physical Therapy

## 2015-10-13 ENCOUNTER — Institutional Professional Consult (permissible substitution): Payer: Medicare PPO | Admitting: Thoracic Surgery (Cardiothoracic Vascular Surgery)

## 2015-10-13 ENCOUNTER — Ambulatory Visit: Payer: Medicare PPO | Admitting: Radiation Oncology

## 2015-10-13 ENCOUNTER — Ambulatory Visit
Admission: RE | Admit: 2015-10-13 | Discharge: 2015-10-13 | Disposition: A | Discharge status: Home / Self Care | Payer: Medicare PPO | Source: Ambulatory Visit | Attending: Radiation Oncology | Admitting: Radiation Oncology

## 2015-10-13 VITALS — BP 124/70 | HR 76 | Temp 98.1°F | Resp 18 | Ht 68.0 in | Wt 176.8 lb

## 2015-10-13 DIAGNOSIS — C3492 Malignant neoplasm of unspecified part of left bronchus or lung: Secondary | ICD-10-CM | POA: Insufficient documentation

## 2015-10-13 DIAGNOSIS — R2681 Unsteadiness on feet: Secondary | ICD-10-CM | POA: Diagnosis present

## 2015-10-13 DIAGNOSIS — Z510 Encounter for antineoplastic radiation therapy: Secondary | ICD-10-CM | POA: Insufficient documentation

## 2015-10-13 DIAGNOSIS — C3432 Malignant neoplasm of lower lobe, left bronchus or lung: Secondary | ICD-10-CM | POA: Insufficient documentation

## 2015-10-13 DIAGNOSIS — R918 Other nonspecific abnormal finding of lung field: Secondary | ICD-10-CM

## 2015-10-13 DIAGNOSIS — R1011 Right upper quadrant pain: Secondary | ICD-10-CM

## 2015-10-13 LAB — COMPREHENSIVE METABOLIC PANEL
ALBUMIN: 3.8 g/dL (ref 3.5–5.0)
ALK PHOS: 87 U/L (ref 40–150)
ALT: 12 U/L (ref 0–55)
ANION GAP: 10 meq/L (ref 3–11)
AST: 10 U/L (ref 5–34)
BILIRUBIN TOTAL: 0.42 mg/dL (ref 0.20–1.20)
BUN: 10.6 mg/dL (ref 7.0–26.0)
CALCIUM: 9.4 mg/dL (ref 8.4–10.4)
CO2: 27 mEq/L (ref 22–29)
CREATININE: 1.3 mg/dL (ref 0.7–1.3)
Chloride: 102 mEq/L (ref 98–109)
EGFR: 57 mL/min/{1.73_m2} — ABNORMAL LOW (ref >90)
Glucose: 125 mg/dl (ref 70–140)
Potassium: 4.5 mEq/L (ref 3.5–5.1)
Sodium: 139 mEq/L (ref 136–145)
TOTAL PROTEIN: 7 g/dL (ref 6.4–8.3)

## 2015-10-13 LAB — CBC WITH DIFFERENTIAL/PLATELET
BASO%: 0.4 % (ref 0.0–2.0)
Basophils Absolute: 0 10*3/uL (ref 0.0–0.1)
EOS%: 1.1 % (ref 0.0–7.0)
Eosinophils Absolute: 0.1 10*3/uL (ref 0.0–0.5)
HEMATOCRIT: 44.4 % (ref 38.4–49.9)
HEMOGLOBIN: 15.1 g/dL (ref 13.0–17.1)
LYMPH#: 1 10*3/uL (ref 0.9–3.3)
LYMPH%: 11.6 % — ABNORMAL LOW (ref 14.0–49.0)
MCH: 29.7 pg (ref 27.2–33.4)
MCHC: 34 g/dL (ref 32.0–36.0)
MCV: 87.4 fL (ref 79.3–98.0)
MONO#: 0.3 10*3/uL (ref 0.1–0.9)
MONO%: 3.6 % (ref 0.0–14.0)
NEUT%: 83.3 % — AB (ref 39.0–75.0)
NEUTROS ABS: 6.9 10*3/uL — AB (ref 1.5–6.5)
PLATELETS: 192 10*3/uL (ref 140–400)
RBC: 5.08 10*6/uL (ref 4.20–5.82)
RDW: 14 % (ref 11.0–14.6)
WBC: 8.3 10*3/uL (ref 4.0–10.3)

## 2015-10-13 NOTE — Therapy (Signed)
[place], [place], [address] Phone: [phone]   Fax:  [phone]  Physical Therapy Evaluation  Patient Details  Name: Christian Espinoza MRN: [id number] Date of Birth: [birth year] Referring Provider: Dr. Curt Bears  Encounter Date: 10/13/2015      PT End of Session - 10/13/15 1559    Visit Number 1   Number of Visits 1   PT Start Time 1017   PT Stop Time 5102   PT Time Calculation (min) 22 min   Activity Tolerance Patient tolerated treatment well   Behavior During Therapy [facility] for tasks assessed/performed      Past Medical History  Diagnosis Date  . HTN (hypertension)   . COPD (chronic obstructive pulmonary disease) ([place])   . Depression     No past surgical history on file.  There were no vitals filed for this visit.       Subjective Assessment - 10/13/15 1437    Subjective recently completed PT for some bilateral knee and one shoulder problem; not currently in PT   Patient is accompained by: --  friend   Pertinent History Patient presented with cough and shortness of breath and was found to have a left lung mass.  Had CT biopsy with found necrotic tissue; bronch EBUS is planned to get tissue biopsy.  PET scan shows suspicious ipsi- and contralateral nodes.  Smoker.   Patient Stated Goals get info from all lung clinic providers   Currently in Pain? No/denies            [facility] PT Assessment - 10/13/15 0001    Assessment   Medical Diagnosis left lower lobe necrotic tissue and suspicious lymph nodes on PET scan   Referring Provider Dr. Curt Bears   Precautions   Precautions Other (comment)   Precaution Comments cancer precautions   Restrictions   Weight Bearing Restrictions No   Balance Screen   Has the patient fallen in the past 6 months No   Has the patient had a decrease in activity level because of a fear of falling?  No   Is the patient reluctant to leave their home  because of a fear of falling?  No   Home Environment   Living Environment Assisted living   Home Equipment None   Prior Function   Level of Independence --  unsure; is in an assisted living facility   Leisure walks most days 30 minutes, but reports he gets short of breath with distance   Cognition   Overall Cognitive Status Difficult to assess   Observation/Other Assessments   Observations Pleasant man accompanied by a friend today, attentive and engaged.   Functional Tests   Functional tests Sit to Stand   Sit to Stand   Comments 12 times in 30 seconds, about average for age  but with mild-moderate dyspnea, tired knees after   Posture/Postural Control   Posture/Postural Control Postural limitations   Postural Limitations Forward head  slight   ROM / Strength   AROM / PROM / Strength AROM   AROM   Overall AROM Comments standing trunk AROM WFL for all motions   Ambulation/Gait   Ambulation/Gait Yes   Ambulation/Gait Assistance 6: Modified independent (Device/Increase time)  becomes SOB with distance, per his report   Gait Comments appears slightly unsteady on his feet with walking a few feet in exam room; says he does okay on stairs   Balance   Balance Assessed Yes   Dynamic Standing  Balance   Dynamic Standing - Comments reaches forward 9 inches in standing, below average for age                           PT Education - 10/13/15 1558    Education provided Yes   Education Details posture, breathing, walking, energy conservation, Cure article on staying active through treatment   Person(s) Educated Patient;Other (comment)  friend/driver   Methods Explanation;Handout   Comprehension Verbalized understanding               Lung Clinic Goals - 10/13/15 1613    Patient will be able to verbalize understanding of the benefit of exercise to decrease fatigue.   Status Achieved   Patient will be able to verbalize the importance of posture.   Status  Achieved   Patient will be able to demonstrate diaphragmatic breathing for improved lung function.   Status Achieved   Patient will be able to verbalize understanding of the role of physical therapy to prevent functional decline and who to contact if physical therapy is needed.   Status Achieved             Plan - 10/13/15 1559    Clinical Impression Statement Pleasant gentleman who lives in an assisted living facility and reports having recently completed physical therapy for his knees and shoulder.  He is attentive for assessment and instruction today.  He shows mild to moderate dyspnea with sit to stand test; he scores lower than average for forward reach in standing; he reports shortness of breath with walking a distance.  He tended to use right > left leg for sit to stand.   Rehab Potential Good   PT Frequency One time visit   PT Treatment/Interventions Patient/family education   PT Next Visit Plan None at this time.  May need therapy going forward.   PT Home Exercise Plan see education section   Consulted and Agree with Plan of Care Patient      Patient will benefit from skilled therapeutic intervention in order to improve the following deficits and impairments:  Cardiopulmonary status limiting activity, Decreased activity tolerance, Decreased endurance, Decreased balance  Visit Diagnosis: Unsteadiness on feet - Plan: PT plan of care cert/re-cert      G-Codes - 08/65/78 1613    Functional Assessment Tool Used clinical judgement   Functional Limitation Mobility: Walking and moving around   Mobility: Walking and Moving Around Current Status [phone]) At least 20 percent but less than 40 percent impaired, limited or restricted   Mobility: Walking and Moving Around Goal Status [phone]) At least 20 percent but less than 40 percent impaired, limited or restricted   Mobility: Walking and Moving Around Discharge Status [phone]) At least 20 percent but less than 40 percent impaired,  limited or restricted       Problem List Patient Active Problem List   Diagnosis Date Noted  . Lung mass 09/08/2015    SALISBURY,DONNA 10/13/2015, 4:17 PM  [place], [place], [address] Phone: [phone]   Fax:  [phone]  Name: Christian Espinoza MRN: [id number] Date of Birth: [birth year]   Serafina Royals, PT 10/13/2015 4:17 PM

## 2015-10-13 NOTE — Progress Notes (Signed)
[place] [phone]   [phone]  OFFICE PROGRESS NOTE  Pcp Not In System No address on file  DIAGNOSIS: Questionable stage IIIB (T1b, N3, M0) non-small cell lung cancer, pending tissue diagnosis  PRIOR THERAPY: None  CURRENT THERAPY: None  INTERVAL HISTORY: Christian Espinoza 70 y.o. male returns to the clinic today for follow-up visit accompanied by his daughter. The patient is feeling fine today with no specific complaints except for occasional right upper quadrant abdominal pain. He denied having any significant chest pain, shortness of breath but has mild cough with no hemoptysis. He has no significant weight loss or night sweats. He has no fever or chills, no nausea or vomiting. He was referred recently to interventional radiology for consideration of CT-guided core biopsy of the left lower lobe lung mass which was performed on 10/06/2015 and the patient is here today for evaluation and discussion of his biopsy results and treatment options.  MEDICAL HISTORY: Past Medical History  Diagnosis Date  . HTN (hypertension)   . COPD (chronic obstructive pulmonary disease) ([place])   . Depression     ALLERGIES:  has No Known Allergies.  MEDICATIONS:  Current Outpatient Prescriptions  Medication Sig Dispense Refill  . acetaminophen (TYLENOL) 325 MG tablet Take 650 mg by mouth.    Marland Kitchen albuterol (PROAIR HFA) 108 (90 Base) MCG/ACT inhaler Inhale 2 puffs into the lungs every 4 (four) hours as needed for wheezing or shortness of breath.     Marland Kitchen amLODipine (NORVASC) 5 MG tablet Take 5 mg by mouth daily.     . Artificial Tear Ointment (REFRESH LACRI-LUBE) OINT Place 1 application into the right eye every 4 (four) hours as needed (Right eye irritation).    . bisacodyl (DULCOLAX) 5 MG EC tablet Take 10 mg by mouth daily as needed for moderate constipation.    Marland Kitchen buPROPion (WELLBUTRIN SR) 150 MG 12 hr tablet Take 150 mg by mouth daily.     Marland Kitchen gabapentin (NEURONTIN)  300 MG capsule Take 300 mg by mouth 2 (two) times daily.    . hydrOXYzine (ATARAX/VISTARIL) 25 MG tablet Take 25 mg by mouth every 6 (six) hours as needed.     Marland Kitchen ipratropium-albuterol (DUONEB) 0.5-2.5 (3) MG/3ML SOLN Take 3 mLs by nebulization every 6 (six) hours.    Marland Kitchen LORazepam (ATIVAN) 0.5 MG tablet Take 0.5 mg by mouth daily as needed for anxiety.    . Polyvinyl Alcohol-Povidone (REFRESH OP) Apply 1 drop to eye 2 (two) times daily.    . predniSONE (DELTASONE) 10 MG tablet Take 10 mg by mouth daily with breakfast.    . risperiDONE (RISPERDAL) 2 MG tablet Take 2 mg by mouth at bedtime.     . senna (SENOKOT) 8.6 MG tablet Take 1 tablet by mouth every evening.    . traMADol (ULTRAM) 50 MG tablet Take 50 mg by mouth every 12 (twelve) hours as needed.    . traZODone (DESYREL) 150 MG tablet Take 150 mg by mouth at bedtime.      No current facility-administered medications for this visit.    SURGICAL HISTORY: No past surgical history on file.  REVIEW OF SYSTEMS:  A comprehensive review of systems was negative except for: Gastrointestinal: positive for abdominal pain   PHYSICAL EXAMINATION: General appearance: alert, cooperative and no distress Head: Normocephalic, without obvious abnormality, atraumatic Neck: no adenopathy, no JVD, supple, symmetrical, trachea midline and thyroid not enlarged, symmetric, no tenderness/mass/nodules Lymph nodes: Cervical, supraclavicular, and axillary  nodes normal. Resp: clear to auscultation bilaterally Back: symmetric, no curvature. ROM normal. No CVA tenderness. Cardio: regular rate and rhythm, S1, S2 normal, no murmur, click, rub or gallop GI: soft, non-tender; bowel sounds normal; no masses,  no organomegaly Extremities: extremities normal, atraumatic, no cyanosis or edema  ECOG PERFORMANCE STATUS: 1 - Symptomatic but completely ambulatory  Blood pressure 124/70, pulse 76, temperature 98.1 F (36.7 C), temperature source Oral, resp. rate 18, height '5\' 8"'$   (1.727 m), weight 176 lb 12.8 oz (80.196 kg), SpO2 98 %.  LABORATORY DATA: Lab Results  Component Value Date   WBC 8.3 10/13/2015   HGB 15.1 10/13/2015   HCT 44.4 10/13/2015   MCV 87.4 10/13/2015   PLT 192 10/13/2015      Chemistry      Component Value Date/Time   NA 137 09/13/2015 1340   K 4.7 09/13/2015 1340   CO2 29 09/13/2015 1340   BUN 13.1 09/13/2015 1340   CREATININE 1.4* 09/13/2015 1340      Component Value Date/Time   CALCIUM 9.3 09/13/2015 1340   ALKPHOS 83 09/13/2015 1340   AST 10 09/13/2015 1340   ALT 12 09/13/2015 1340   BILITOT 0.49 09/13/2015 1340       RADIOGRAPHIC STUDIES: Dg Chest 1 View  10/06/2015  CLINICAL DATA:  Post left lung biopsy EXAM: CHEST 1 VIEW COMPARISON:  CT chest for biopsy dated 10/06/2015 FINDINGS: The mass is again noted in the superior segment of the left lower lobe with spiculated margins suspicious for primary lung carcinoma. After biopsy, no pneumothorax is seen. No pleural effusion is noted. Heart size is stable. IMPRESSION: No pneumothorax after biopsy of left lower lobe lung lesion. Electronically Signed   By: Ivar Drape M.D.   On: 10/06/2015 16:29   Mr Jeri Cos IO Contrast  10/04/2015  CLINICAL DATA:  New diagnosis of lung mass.  Staging. EXAM: MRI HEAD WITHOUT AND WITH CONTRAST TECHNIQUE: Multiplanar, multiecho pulse sequences of the brain and surrounding structures were obtained without and with intravenous contrast. CONTRAST:  62m MULTIHANCE GADOBENATE DIMEGLUMINE 529 MG/ML IV SOLN COMPARISON:  None. FINDINGS: Calvarium and upper cervical spine: Partly seen C5 ACDF hardware. No focal marrow signal abnormality. Orbits: Cataract resection bilaterally.  No signs of metastasis. Sinuses and Mastoids: Clear. Brain: No evidence of metastasis. No acute or remote infarct, hemorrhage, hydrocephalus, or swelling. No evidence of large vessel occlusion. IMPRESSION: Negative for metastatic disease to the brain. Electronically Signed   By: JMonte FantasiaM.D.   On: 10/04/2015 09:06   Nm Pet Image Initial (pi) Skull Base To Thigh  09/26/2015  CLINICAL DATA:  Initial treatment strategy for lung nodule. EXAM: NUCLEAR MEDICINE PET SKULL BASE TO THIGH TECHNIQUE: 9.2 mCi F-18 FDG was injected intravenously. Full-ring PET imaging was performed from the skull base to thigh after the radiotracer. CT data was obtained and used for attenuation correction and anatomic localization. FASTING BLOOD GLUCOSE:  Value: 69 mg/dl COMPARISON:  Chest CT 08/31/2015. FINDINGS: NECK No hypermetabolic lymph nodes in the neck. CHEST 2.8 cm spiculated nodule in the superior segment left lower lobe, involving the major fissure is markedly hypermetabolic with SUV max = 127.0 Central photopenia in this nodule is compatible with necrosis. The 11 mm AP window lymph node measured on the previous study is now 12 mm in short axis and demonstrates hypermetabolic FDG accumulation with SUV max = 4.9. 11 mm subcarinal lymph node identified on the previous study is hypermetabolic today with SUV max =  4.2. There is hypermetabolism in the posterior left hilum although no lymph node can be identified at this location on today's CT scan without intravenous contrast material. Coronary artery calcification is evident. No evidence of pericardial effusion. ABDOMEN/PELVIS No abnormal hypermetabolic activity within the liver, pancreas, adrenal glands, or spleen. No hypermetabolic lymph nodes in the abdomen or pelvis. There is abdominal aortic atherosclerosis without aneurysm. No adrenal nodule or mass. Diverticular changes are noted in the left colon without diverticulitis. SKELETON Bilateral inguinal hernias contain only fat. No evidence for hypermetabolic bone lesion to suggest osseous metastatic disease. IMPRESSION: Markedly hypermetabolic 2.8 cm left lower lobe pulmonary nodule shows central necrosis. Imaging features are compatible with primary bronchogenic carcinoma. Hypermetabolic AP window and  subcarinal lymph nodes suggest metastatic disease. There is focal hypermetabolism in the left hilum although discrete lymph nodes are not evident on the CT scan without intravenous contrast material. Electronically Signed   By: Misty Stanley M.D.   On: 09/26/2015 15:54   Ct Biopsy  10/06/2015  INDICATION: 70 year old male with a suspicious lesion in the superior segment of the left lower lobe. Tissue diagnosis is needed. EXAM: CT-GUIDED BIOPSY OF LEFT LUNG LESION MEDICATIONS: None. ANESTHESIA/SEDATION: Moderate (conscious) sedation was employed during this procedure. A total of Versed 1.5 mg and Fentanyl 50 mcg was administered intravenously. Moderate Sedation Time: 20 minutes. The patient's level of consciousness and vital signs were monitored continuously by radiology nursing throughout the procedure under my direct supervision. FLUOROSCOPY TIME:  None COMPLICATIONS: None immediate. PROCEDURE: Informed written consent was obtained from the patient after a thorough discussion of the procedural risks, benefits and alternatives. All questions were addressed. A timeout was performed prior to the initiation of the procedure. Patient was initially placed prone and CT images were obtained. The patient was repositioned on his left side. Images through the chest were obtained. The lesion in the superior segment of left lower lobe was targeted. The left side of the back was prepped with chlorhexidine and a sterile field was created. Skin was anesthetized with 1% lidocaine. 17 gauge coaxial needle was directed into the lesion with CT guidance. Two core biopsies were obtained with an 18 gauge device. Specimens placed in formalin. 17 gauge needle was removed using Bio[employer] tract sealant. Follow up CT images were obtained. Bandage placed over the puncture site. FINDINGS: 2.9 cm lesion in the superior segment of left lower lobe. Needle was directed to the posterior aspect of the lesion and 2 suitable core biopsies were  obtained. No evidence for pneumothorax following the core biopsies. IMPRESSION: Successful CT-guided core biopsies of the left lung lesion. Electronically Signed   By: Markus Daft M.D.   On: 10/06/2015 14:02    ASSESSMENT AND PLAN: This is a very pleasant 70 years old white male was questionably stage IIIB non-small cell lung cancer, pending tissue diagnosis. MRI of the brain performed recently showed no evidence of metastatic disease to the brain. Unfortunately the CT-guided core biopsy of the left lower lobe lung mass showed necrotic tissue with no malignancy. This is still highly suspicious for non-small cell lung cancer. We discussed the patient's case at the weekly thoracic conference.  I recommended for the patient to consider bronchoscopy with endobronchial ultrasound and biopsies. The patient will see Dr. Roxan Hockey later today for evaluation and discussion of this option. I will arrange for him to come back for follow-up visit after confirmation of the tissue diagnosis for more detailed discussion of his treatment options. He was advised to call  immediately if he has any concerning symptoms in the interval. The patient voices understanding of current disease status and treatment options and is in agreement with the current care plan.  All questions were answered. The patient knows to call the clinic with any problems, questions or concerns. We can certainly see the patient much sooner if necessary.  Disclaimer: This note was dictated with voice recognition software. Similar sounding words can inadvertently be transcribed and may not be corrected upon review.

## 2015-10-13 NOTE — Progress Notes (Signed)
[facility] Clinical Social Work  Clinical Social Work met with patient at [facility] appointment to offer support and assess for psychosocial needs.  Christian Espinoza was accompanied by his friend, Christian Espinoza.  Christian Espinoza also provides transportation at [facility]. Christian Espinoza reported his major cause of distress was not knowing the results of the biopsy and not having a treatment plan yet.  CSW validated patients feelings of anxiety regarding "the unknown".  Christian Espinoza did not report a large network of support, shared he is estranged from his children.  He was concerned about insurance and the cost of care.  CSW encouraged patient to meet with financial advocates to explore resources and possibility of applying for Medicaid.  ONCBCN DISTRESS SCREENING 10/13/2015  Screening Type Initial Screening  Distress experienced in past week (1-10) 5  Practical problem type Insurance  Family Problem type Other (comment)  Emotional problem type Depression;Nervousness/Anxiety;Adjusting to illness  Information Concerns Type Lack of info about diagnosis;Lack of info about treatment  Referral to clinical social work Yes    Clinical Social Work briefly discussed Clinical Social Work role and [employer] support programs/services.  Clinical Social Work encouraged patient to call with any additional questions or concerns.   [name], MSW, LCSW, OSW-C Clinical Social Worker [facility] [phone]

## 2015-10-13 NOTE — Progress Notes (Signed)
Oncology Nurse Navigator Documentation  Oncology Nurse Navigator Flowsheets 10/13/2015  Navigator Encounter Type Clinic/MDC  Abnormal Finding Date 08/31/2015  Patient Visit Type Other  Treatment Phase Pre-Tx/Tx Discussion  Barriers/Navigation Needs Coordination of Care  Interventions Coordination of Care  Coordination of Care Appts  Acuity Level 1  Time Spent with Patient 61   Spoke with patient today.  He came to [facility] today to discuss the need for tissue dx.  Dr. Julien Nordmann spoke with patient.  Patient was unable to see Dr. Roxan Hockey and his office will setup another appt.

## 2015-10-14 ENCOUNTER — Encounter: Payer: Self-pay | Admitting: *Deleted

## 2015-10-14 NOTE — Progress Notes (Signed)
Oncology Nurse Navigator Documentation  Oncology Nurse Navigator Flowsheets 10/14/2015  Navigator Encounter Type Other  Patient Visit Type Other  Acuity Level 2  Time Spent with Patient 30   I noticed that patient is not set up to see Dr. Roxan Hockey.  I called his office to get an appt.

## 2015-10-19 ENCOUNTER — Other Ambulatory Visit: Payer: Self-pay | Admitting: Thoracic Surgery (Cardiothoracic Vascular Surgery)

## 2015-10-19 ENCOUNTER — Other Ambulatory Visit: Payer: Self-pay | Admitting: *Deleted

## 2015-10-19 DIAGNOSIS — R918 Other nonspecific abnormal finding of lung field: Secondary | ICD-10-CM

## 2015-10-25 ENCOUNTER — Ambulatory Visit
Admission: RE | Admit: 2015-10-25 | Discharge: 2015-10-25 | Disposition: A | Discharge status: Home / Self Care | Payer: Medicare PPO | Source: Ambulatory Visit | Attending: Thoracic Surgery (Cardiothoracic Vascular Surgery) | Admitting: Thoracic Surgery (Cardiothoracic Vascular Surgery)

## 2015-10-25 DIAGNOSIS — R918 Other nonspecific abnormal finding of lung field: Secondary | ICD-10-CM

## 2015-11-01 ENCOUNTER — Encounter: Payer: Self-pay | Admitting: Thoracic Surgery (Cardiothoracic Vascular Surgery)

## 2015-11-01 ENCOUNTER — Other Ambulatory Visit: Payer: Self-pay | Admitting: *Deleted

## 2015-11-01 ENCOUNTER — Ambulatory Visit (INDEPENDENT_AMBULATORY_CARE_PROVIDER_SITE_OTHER): Payer: Medicare PPO | Admitting: Thoracic Surgery (Cardiothoracic Vascular Surgery)

## 2015-11-01 VITALS — BP 123/76 | HR 60 | Resp 18 | Ht 68.0 in | Wt 176.0 lb

## 2015-11-01 DIAGNOSIS — R599 Enlarged lymph nodes, unspecified: Secondary | ICD-10-CM

## 2015-11-01 DIAGNOSIS — R918 Other nonspecific abnormal finding of lung field: Secondary | ICD-10-CM

## 2015-11-01 DIAGNOSIS — D381 Neoplasm of uncertain behavior of trachea, bronchus and lung: Secondary | ICD-10-CM | POA: Diagnosis not present

## 2015-11-01 DIAGNOSIS — R591 Generalized enlarged lymph nodes: Secondary | ICD-10-CM

## 2015-11-01 DIAGNOSIS — R911 Solitary pulmonary nodule: Secondary | ICD-10-CM

## 2015-11-01 NOTE — Progress Notes (Signed)
PCP is Pcp Not In System Referring Provider is Curt Bears, MD  Chief Complaint  Patient presents with  . Follow-up    after CT CHEST 10/25/15.Marland KitchenMarland Kitchenprevious bx nondx...resides at [facility]  . Lung Lesion    RML  . Lung Mass    LUL    HPI: Christian Espinoza is sent for consultation by Dr. Julien Nordmann for biopsy of a left lower lobe lung mass.  Christian Espinoza is a 70 year old gentleman who is a resident in an assisted living facility [facility]). He has a long history of tobacco abuse dating back about 50 years. He smoked about a pack a day during most of that time. He is currently smoking 1 cigarette a day. He has a history of COPD and hypertension. He also has a history of depression. He is a poor historian and I do not have any outside records for reference.  He was found to have a 3 cm left lung nodule on a CT scan. He saw Dr. Julien Nordmann. A PET CT showed hypermetabolic activity in the nodule and also in AP window, left hilar, and subcarinal lymph nodes. There was no evidence of distant metastases. A CT-guided biopsy was done which showed only necrotic material. No viable tumor cells were seen. He now is referred for consideration for bronchoscopic biopsy.  Zubrod Score: At the time of surgery this patient's most appropriate activity status/level should be described as: '[]'$     0    Normal activity, no symptoms '[]'$     1    Restricted in physical strenuous activity but ambulatory, able to do out light work '[x]'$     2    Ambulatory and capable of self care, unable to do work activities, up and about >50 % of waking hours                              '[]'$     3    Only limited self care, in bed greater than 50% of waking hours '[]'$     4    Completely disabled, no self care, confined to bed or chair '[]'$     5    Moribund   Past Medical History  Diagnosis Date  . HTN (hypertension)   . COPD (chronic obstructive pulmonary disease) ([place])   . Depression     No past surgical history on file.  Family History  Problem  Relation Age of Onset  . COPD Mother   . Cancer Father     Social History Social History  Substance Use Topics  . Smoking status: Heavy Tobacco Smoker -- 2.00 packs/day for 15 years    Types: Cigarettes  . Smokeless tobacco: None     Comment: SMOKES 1 CIG PER DAY  . Alcohol Use: No    Current Outpatient Prescriptions  Medication Sig Dispense Refill  . acetaminophen (TYLENOL) 325 MG tablet Take 650 mg by mouth. Reported on 10/13/2015    . albuterol (PROAIR HFA) 108 (90 Base) MCG/ACT inhaler Inhale 2 puffs into the lungs every 4 (four) hours as needed for wheezing or shortness of breath.     Marland Kitchen amLODipine (NORVASC) 5 MG tablet Take 5 mg by mouth daily.     . Artificial Tear Ointment (REFRESH LACRI-LUBE) OINT Place 1 application into the right eye every 4 (four) hours as needed (Right eye irritation).    . bisacodyl (DULCOLAX) 5 MG EC tablet Take 10 mg by mouth daily as needed  for moderate constipation.    Marland Kitchen buPROPion (WELLBUTRIN SR) 150 MG 12 hr tablet Take 150 mg by mouth daily.     Marland Kitchen gabapentin (NEURONTIN) 300 MG capsule Take 300 mg by mouth 2 (two) times daily.    . hydrOXYzine (ATARAX/VISTARIL) 25 MG tablet Take 25 mg by mouth every 6 (six) hours as needed.     Marland Kitchen ipratropium-albuterol (DUONEB) 0.5-2.5 (3) MG/3ML SOLN Take 3 mLs by nebulization every 6 (six) hours.    . Polyvinyl Alcohol-Povidone (REFRESH OP) Apply 1 drop to eye 2 (two) times daily.    . predniSONE (DELTASONE) 10 MG tablet Take 10 mg by mouth daily with breakfast.    . risperiDONE (RISPERDAL) 2 MG tablet Take 2 mg by mouth at bedtime.     . senna (SENOKOT) 8.6 MG tablet Take 1 tablet by mouth every evening.    . traZODone (DESYREL) 150 MG tablet Take 150 mg by mouth at bedtime.      No current facility-administered medications for this visit.    No Known Allergies  Review of Systems  Constitutional: Negative for fever, chills, activity change, appetite change, fatigue and unexpected weight change.  HENT:  Negative for trouble swallowing and voice change.   Respiratory: Positive for shortness of breath and wheezing. Negative for cough.   Cardiovascular: Negative for chest pain and leg swelling.  Gastrointestinal: Negative for blood in stool.       Heart burn  Genitourinary: Negative for dysuria and hematuria.  Neurological: Positive for tremors. Negative for seizures and syncope.  Hematological: Does not bruise/bleed easily.  Psychiatric/Behavioral: Positive for dysphoric mood.  All other systems reviewed and are negative.   BP 123/76 mmHg  Pulse 60  Resp 18  Ht '5\' 8"'$  (3.785 m)  Wt 176 lb (79.833 kg)  BMI 26.77 kg/m2  SpO2 98% Physical Exam  Constitutional: He is oriented to person, place, and time. He appears well-developed and well-nourished. No distress.  HENT:  Head: Normocephalic and atraumatic.  Mouth/Throat: No oropharyngeal exudate.  Eyes: Conjunctivae and EOM are normal. Pupils are equal, round, and reactive to light. No scleral icterus.  Neck: Neck supple. No thyromegaly present.  Cardiovascular: Normal rate, regular rhythm and normal heart sounds.  Exam reveals no gallop and no friction rub.   No murmur heard. Pulmonary/Chest: Effort normal. No respiratory distress. He has wheezes. He has no rales.  Abdominal: Soft. He exhibits no distension. There is no tenderness.  Musculoskeletal: Normal range of motion. He exhibits no edema.  Lymphadenopathy:    He has no cervical adenopathy.  Neurological: He is alert and oriented to person, place, and time. No cranial nerve deficit. He exhibits normal muscle tone.  Psychiatric:  flat affect  Vitals reviewed.    Diagnostic Tests: CT CHEST WITHOUT CONTRAST  TECHNIQUE: Multidetector CT imaging of the chest was performed following the standard protocol without IV contrast.  COMPARISON: 10/06/2015 and 09/26/2015  FINDINGS: The study is limited without IV contrast. Images of the thoracic inlet are unremarkable. Central  airways are patent. A precarinal lymph node measures 1 by 1.5 cm borderline enlarged. No significant hilar adenopathy is noted on this unenhanced scan. Heart size within normal limits. Atherosclerotic calcifications of coronary arteries. Atherosclerotic calcifications of thoracic aorta.  Images of the lung parenchyma shows again extensive emphysematous changes bilaterally. Again noted nodular spiculated mass in superior segment of left lower lobe measures 3.7 x 2.7 cm. There is slight progression in size from prior exam when measures 3.2 by 2.7 cm.  There is adjacent mild pleural retraction and small loculated pleural effusion. Minimal adjacent pleural thickening. Axial image 80 there is mild central interstitial thickening in the right lower lobe laterally. Axial image 81 there is 5 mm nodule in right middle lobe laterally new from prior exam.  There is mild peripheral interstitial prominence in right middle lobe and right lower lobe.  The visualized upper abdomen shows no adrenal gland mass. The unenhanced liver shows no biliary ductal dilatation. Unenhanced spleen is unremarkable. The visualized pancreas is atrophic. No calcified gallstones are noted within gallbladder.  Sagittal images of the spine shows mild degenerative changes thoracic spine. No destructive bony lesions are noted. Sagittal view of the sternum is unremarkable. No destructive rib lesions are identified.  IMPRESSION: 1. Again noted spiculated mass in superior segment of left lower lobe measures 3.7 x 2.7 cm. There is minimal progression in size from prior exam when measures 3.2 cm x 2.7 cm. There is mild adjacent pleural retraction and minimal loculated pleural fluid and pleural thickening. Please see axial image 50. The study is limited without IV contrast. 2. There is indeterminate 5 mm nodule in right middle lobe new from prior exam. 3. Again noted extensive emphysematous changes bilaterally. There  is some peripheral interstitial prominence in right middle lobe and right lower lobe. Some central interstitial prominence with streaky appearance in right lower lobe laterally axial image 80. Minimal pneumonitis cannot be excluded. 4. Borderline enlarged precarinal lymph node. 5. No evidence of bony metastatic disease. No hilar adenopathy is noted on this unenhanced scan.   Electronically Signed  By: Lahoma Crocker M.D.  On: 10/25/2015 12:38 NUCLEAR MEDICINE PET SKULL BASE TO THIGH  TECHNIQUE: 9.2 mCi F-18 FDG was injected intravenously. Full-ring PET imaging was performed from the skull base to thigh after the radiotracer. CT data was obtained and used for attenuation correction and anatomic localization.  FASTING BLOOD GLUCOSE: Value: 69 mg/dl  COMPARISON: Chest CT 08/31/2015.  FINDINGS: NECK  No hypermetabolic lymph nodes in the neck.  CHEST  2.8 cm spiculated nodule in the superior segment left lower lobe, involving the major fissure is markedly hypermetabolic with SUV max = 40.9. Central photopenia in this nodule is compatible with necrosis.  The 11 mm AP window lymph node measured on the previous study is now 12 mm in short axis and demonstrates hypermetabolic FDG accumulation with SUV max = 4.9. 11 mm subcarinal lymph node identified on the previous study is hypermetabolic today with SUV max = 4.2. There is hypermetabolism in the posterior left hilum although no lymph node can be identified at this location on today's CT scan without intravenous contrast material.  Coronary artery calcification is evident. No evidence of pericardial effusion.  ABDOMEN/PELVIS  No abnormal hypermetabolic activity within the liver, pancreas, adrenal glands, or spleen. No hypermetabolic lymph nodes in the abdomen or pelvis.  There is abdominal aortic atherosclerosis without aneurysm. No adrenal nodule or mass. Diverticular changes are noted in the left colon  without diverticulitis.  SKELETON  Bilateral inguinal hernias contain only fat. No evidence for hypermetabolic bone lesion to suggest osseous metastatic disease.  IMPRESSION: Markedly hypermetabolic 2.8 cm left lower lobe pulmonary nodule shows central necrosis. Imaging features are compatible with primary bronchogenic carcinoma.  Hypermetabolic AP window and subcarinal lymph nodes suggest metastatic disease. There is focal hypermetabolism in the left hilum although discrete lymph nodes are not evident on the CT scan without intravenous contrast material.   Electronically Signed  By: Misty Stanley M.D.  On:  09/26/2015 15:54  I personally reviewed the CT and PET/CT and concur with the findings noted above.  Impression: 70 year old man with a history of tobacco abuse and COPD who has a 3.7 cm mass in the left lung. This bridges the major fissure, but appears to arise from the superior segment of the left lower lobe. He does have hypermetabolic activity in hilar, AP window, and subcarinal nodes although the nodes themselves are not remarkable in size. The clinical picture is most consistent with a stage III lung cancer, but we do not yet have a diagnosis.  Given that he has had a failed CT-guided biopsy, my recommendation is navigational bronchoscopy and endobronchial ultrasound for diagnostic purposes. This will be done in the operating room under general anesthesia on an outpatient basis. I discussed the general nature of the procedure with the patient. He understands that it is diagnostic and not therapeutic. I reviewed the indications, risks, benefits, and alternatives. He understands the risks include those associated with general anesthesia, and include death, MI, stroke, bleeding, blood clots, pneumothorax, failure to make a diagnosis, as well as the possibility of other unforeseeable complications.  He accepts the risks and wishes to proceed.  Plan: Navigational  bronchoscopy and endobronchial ultrasound on Thursday, 11/10/2015  Melrose Nakayama, MD [facility] [phone]

## 2015-11-09 ENCOUNTER — Encounter (HOSPITAL_COMMUNITY): Payer: Self-pay | Admitting: *Deleted

## 2015-11-09 NOTE — Anesthesia Preprocedure Evaluation (Addendum)
Anesthesia Evaluation  Patient identified by MRN, date of birth, ID band Patient awake    Reviewed: Allergy & Precautions, NPO status , Patient's Chart, lab work & pertinent test results  Airway Mallampati: II  TM Distance: >3 FB Neck ROM: Full    Dental  (+) Lower Dentures, Partial Upper, Upper Dentures, Dental Advidsory Given   Pulmonary COPD,  COPD inhaler, Current Smoker,  LLL lung mass   Pulmonary exam normal breath sounds clear to auscultation       Cardiovascular hypertension, Pt. on medications Normal cardiovascular exam Rhythm:Regular Rate:Normal     Neuro/Psych PSYCHIATRIC DISORDERS Depression    GI/Hepatic negative GI ROS, Neg liver ROS,   Endo/Other  negative endocrine ROS  Renal/GU negative Renal ROS  negative genitourinary   Musculoskeletal negative musculoskeletal ROS (+)   Abdominal   Peds  Hematology negative hematology ROS (+)   Anesthesia Other Findings   Reproductive/Obstetrics                           Anesthesia Physical Anesthesia Plan  ASA: III  Anesthesia Plan: General   Post-op Pain Management:    Induction: Intravenous  Airway Management Planned: Oral ETT  Additional Equipment:   Intra-op Plan:   Post-operative Plan: Extubation in OR  Informed Consent: I have reviewed the patients History and Physical, chart, labs and discussed the procedure including the risks, benefits and alternatives for the proposed anesthesia with the patient or authorized representative who has indicated his/her understanding and acceptance.   Dental advisory given and Dental Advisory Given  Plan Discussed with: CRNA, Anesthesiologist and Surgeon  Anesthesia Plan Comments:        Anesthesia Quick Evaluation

## 2015-11-09 NOTE — Progress Notes (Signed)
Pt SDW-Pre-op call completed by Elray Mcgregor, Med tech [phone]. At [facility]. Please assess pt for anesthesia complications on DOS, pt unavailable. Christinia Gully stated that pt had no surgical history, please confirm with pt on DOS. Shantae not aware if pt was under the care of a cardiologist or had a stress test or cardiac cath performed, please confirm DOS along with adding pt neck size to complete Sleep Apnea Assessment. Harrell Gave made aware to have pt stop taking Aspirin, vitamins, fish oil and herbal medications. Do not take any NSAIDs ie: Ibuprofen, Advil, Naproxen, BC and Goody Powder or any medication containing Aspirin. Shantea verbalized understanding of all pr-op instructions.

## 2015-11-10 ENCOUNTER — Encounter: Payer: Self-pay | Admitting: *Deleted

## 2015-11-10 ENCOUNTER — Encounter (HOSPITAL_COMMUNITY): Payer: Self-pay | Admitting: Anesthesiology

## 2015-11-10 ENCOUNTER — Ambulatory Visit (HOSPITAL_COMMUNITY): Payer: Medicare PPO

## 2015-11-10 ENCOUNTER — Ambulatory Visit (HOSPITAL_COMMUNITY)
Admission: RE | Admit: 2015-11-10 | Discharge: 2015-11-10 | Disposition: A | Discharge status: Home / Self Care | Payer: Medicare PPO | Source: Ambulatory Visit | Attending: Thoracic Surgery (Cardiothoracic Vascular Surgery) | Admitting: Thoracic Surgery (Cardiothoracic Vascular Surgery)

## 2015-11-10 ENCOUNTER — Ambulatory Visit (HOSPITAL_COMMUNITY): Payer: Medicare PPO | Admitting: Anesthesiology

## 2015-11-10 ENCOUNTER — Encounter (HOSPITAL_COMMUNITY)
Admission: RE | Disposition: A | Discharge status: Home / Self Care | Payer: Self-pay | Source: Ambulatory Visit | Attending: Thoracic Surgery (Cardiothoracic Vascular Surgery)

## 2015-11-10 DIAGNOSIS — R591 Generalized enlarged lymph nodes: Secondary | ICD-10-CM

## 2015-11-10 DIAGNOSIS — I10 Essential (primary) hypertension: Secondary | ICD-10-CM | POA: Insufficient documentation

## 2015-11-10 DIAGNOSIS — F17210 Nicotine dependence, cigarettes, uncomplicated: Secondary | ICD-10-CM | POA: Insufficient documentation

## 2015-11-10 DIAGNOSIS — F329 Major depressive disorder, single episode, unspecified: Secondary | ICD-10-CM | POA: Insufficient documentation

## 2015-11-10 DIAGNOSIS — C3432 Malignant neoplasm of lower lobe, left bronchus or lung: Secondary | ICD-10-CM | POA: Insufficient documentation

## 2015-11-10 DIAGNOSIS — R599 Enlarged lymph nodes, unspecified: Secondary | ICD-10-CM

## 2015-11-10 DIAGNOSIS — Z419 Encounter for procedure for purposes other than remedying health state, unspecified: Secondary | ICD-10-CM

## 2015-11-10 DIAGNOSIS — R911 Solitary pulmonary nodule: Secondary | ICD-10-CM

## 2015-11-10 DIAGNOSIS — R918 Other nonspecific abnormal finding of lung field: Secondary | ICD-10-CM

## 2015-11-10 DIAGNOSIS — J449 Chronic obstructive pulmonary disease, unspecified: Secondary | ICD-10-CM | POA: Insufficient documentation

## 2015-11-10 HISTORY — DX: Reserved for inherently not codable concepts without codable children: IMO0001

## 2015-11-10 HISTORY — DX: Solitary pulmonary nodule: R91.1

## 2015-11-10 HISTORY — PX: VIDEO BRONCHOSCOPY WITH ENDOBRONCHIAL NAVIGATION: SHX6175

## 2015-11-10 HISTORY — PX: VIDEO BRONCHOSCOPY WITH ENDOBRONCHIAL ULTRASOUND: SHX6177

## 2015-11-10 HISTORY — DX: Anxiety disorder, unspecified: F41.9

## 2015-11-10 HISTORY — DX: Unspecified cataract: H26.9

## 2015-11-10 LAB — CBC
HCT: 44 % (ref 39.0–52.0)
Hemoglobin: 14.4 g/dL (ref 13.0–17.0)
MCH: 28.5 pg (ref 26.0–34.0)
MCHC: 32.7 g/dL (ref 30.0–36.0)
MCV: 87.1 fL (ref 78.0–100.0)
PLATELETS: 181 10*3/uL (ref 150–400)
RBC: 5.05 MIL/uL (ref 4.22–5.81)
RDW: 13.6 % (ref 11.5–15.5)
WBC: 12.7 10*3/uL — ABNORMAL HIGH (ref 4.0–10.5)

## 2015-11-10 LAB — COMPREHENSIVE METABOLIC PANEL
ALT: 15 U/L — AB (ref 17–63)
ANION GAP: 8 (ref 5–15)
AST: 17 U/L (ref 15–41)
Albumin: 3.4 g/dL — ABNORMAL LOW (ref 3.5–5.0)
Alkaline Phosphatase: 74 U/L (ref 38–126)
BUN: 12 mg/dL (ref 6–20)
CALCIUM: 8.9 mg/dL (ref 8.9–10.3)
CHLORIDE: 103 mmol/L (ref 101–111)
CO2: 25 mmol/L (ref 22–32)
CREATININE: 1.17 mg/dL (ref 0.61–1.24)
Glucose, Bld: 86 mg/dL (ref 65–99)
Potassium: 3.9 mmol/L (ref 3.5–5.1)
Sodium: 136 mmol/L (ref 135–145)
Total Bilirubin: 0.5 mg/dL (ref 0.3–1.2)
Total Protein: 6 g/dL — ABNORMAL LOW (ref 6.5–8.1)

## 2015-11-10 LAB — PROTIME-INR
INR: 1.07 (ref 0.00–1.49)
PROTHROMBIN TIME: 14.1 s (ref 11.6–15.2)

## 2015-11-10 LAB — APTT: aPTT: 28 seconds (ref 24–37)

## 2015-11-10 SURGERY — VIDEO BRONCHOSCOPY WITH ENDOBRONCHIAL NAVIGATION
Anesthesia: General

## 2015-11-10 MED ORDER — NEOSTIGMINE METHYLSULFATE 5 MG/5ML IV SOSY
PREFILLED_SYRINGE | INTRAVENOUS | Status: AC
  Filled 2015-11-10: qty 5

## 2015-11-10 MED ORDER — FENTANYL CITRATE (PF) 100 MCG/2ML IJ SOLN: 25.0000 ug | INTRAMUSCULAR | Status: DC | PRN

## 2015-11-10 MED ORDER — MEPERIDINE HCL 25 MG/ML IJ SOLN: 6.2500 mg | INTRAMUSCULAR | Status: DC | PRN

## 2015-11-10 MED ORDER — EPINEPHRINE HCL 1 MG/ML IJ SOLN
INTRAMUSCULAR | Status: DC | PRN
  Administered 2015-11-10: 1 mg via ENDOTRACHEOPULMONARY

## 2015-11-10 MED ORDER — ROCURONIUM BROMIDE 100 MG/10ML IV SOLN
INTRAVENOUS | Status: DC | PRN
  Administered 2015-11-10: 10 mg via INTRAVENOUS
  Administered 2015-11-10: 40 mg via INTRAVENOUS
  Administered 2015-11-10: 10 mg via INTRAVENOUS
  Administered 2015-11-10: 20 mg via INTRAVENOUS

## 2015-11-10 MED ORDER — EPINEPHRINE HCL 1 MG/ML IJ SOLN
INTRAMUSCULAR | Status: AC
  Filled 2015-11-10: qty 1

## 2015-11-10 MED ORDER — SUGAMMADEX SODIUM 200 MG/2ML IV SOLN
INTRAVENOUS | Status: DC | PRN
  Administered 2015-11-10: 159.6 mg via INTRAVENOUS

## 2015-11-10 MED ORDER — DEXAMETHASONE SODIUM PHOSPHATE 10 MG/ML IJ SOLN
INTRAMUSCULAR | Status: DC | PRN
  Administered 2015-11-10: 10 mg via INTRAVENOUS

## 2015-11-10 MED ORDER — ALBUTEROL SULFATE (2.5 MG/3ML) 0.083% IN NEBU
INHALATION_SOLUTION | RESPIRATORY_TRACT | Status: AC
  Filled 2015-11-10: qty 3

## 2015-11-10 MED ORDER — GLYCOPYRROLATE 0.2 MG/ML IJ SOLN
INTRAMUSCULAR | Status: DC | PRN
  Administered 2015-11-10: .2 mg via INTRAVENOUS

## 2015-11-10 MED ORDER — 0.9 % SODIUM CHLORIDE (POUR BTL) OPTIME
TOPICAL | Status: DC | PRN
  Administered 2015-11-10: 1000 mL

## 2015-11-10 MED ORDER — ROCURONIUM BROMIDE 50 MG/5ML IV SOLN
INTRAVENOUS | Status: AC
  Filled 2015-11-10: qty 1

## 2015-11-10 MED ORDER — ONDANSETRON HCL 4 MG/2ML IJ SOLN
INTRAMUSCULAR | Status: AC
  Filled 2015-11-10: qty 2

## 2015-11-10 MED ORDER — LACTATED RINGERS IV SOLN
INTRAVENOUS | Status: DC | PRN
  Administered 2015-11-10: 07:00:00 via INTRAVENOUS

## 2015-11-10 MED ORDER — ONDANSETRON HCL 4 MG/2ML IJ SOLN: 4.0000 mg | Freq: Once | INTRAMUSCULAR | Status: DC | PRN

## 2015-11-10 MED ORDER — GLYCOPYRROLATE 0.2 MG/ML IV SOSY
PREFILLED_SYRINGE | INTRAVENOUS | Status: AC
  Filled 2015-11-10: qty 3

## 2015-11-10 MED ORDER — FENTANYL CITRATE (PF) 250 MCG/5ML IJ SOLN
INTRAMUSCULAR | Status: AC
  Filled 2015-11-10: qty 5

## 2015-11-10 MED ORDER — PROPOFOL 10 MG/ML IV BOLUS
INTRAVENOUS | Status: DC | PRN
  Administered 2015-11-10: 150 mg via INTRAVENOUS

## 2015-11-10 MED ORDER — LIDOCAINE HCL (CARDIAC) 20 MG/ML IV SOLN
INTRAVENOUS | Status: DC | PRN
  Administered 2015-11-10: 30 mg via INTRAVENOUS

## 2015-11-10 MED ORDER — PROPOFOL 10 MG/ML IV BOLUS
INTRAVENOUS | Status: AC
  Filled 2015-11-10: qty 20

## 2015-11-10 MED ORDER — FENTANYL CITRATE (PF) 100 MCG/2ML IJ SOLN
INTRAMUSCULAR | Status: DC | PRN
  Administered 2015-11-10: 100 ug via INTRAVENOUS

## 2015-11-10 MED ORDER — PHENYLEPHRINE HCL 10 MG/ML IJ SOLN
10.0000 mg | INTRAMUSCULAR | Status: DC | PRN
  Administered 2015-11-10: 50 ug/min via INTRAVENOUS

## 2015-11-10 MED ORDER — EPHEDRINE SULFATE 50 MG/ML IJ SOLN
INTRAMUSCULAR | Status: DC | PRN
  Administered 2015-11-10 (×2): 10 mg via INTRAVENOUS
  Administered 2015-11-10: 15 mg via INTRAVENOUS

## 2015-11-10 MED ORDER — LIDOCAINE 2% (20 MG/ML) 5 ML SYRINGE
INTRAMUSCULAR | Status: AC
  Filled 2015-11-10: qty 5

## 2015-11-10 MED ORDER — ALBUTEROL SULFATE (2.5 MG/3ML) 0.083% IN NEBU
2.5000 mg | INHALATION_SOLUTION | Freq: Once | RESPIRATORY_TRACT | Status: AC
  Administered 2015-11-10: 2.5 mg via RESPIRATORY_TRACT

## 2015-11-10 SURGICAL SUPPLY — 49 items
ADAPTER BRONCH F/PENTAX (ADAPTER) ×3 IMPLANT
BRUSH CYTOL CELLEBRITY 1.5X140 (MISCELLANEOUS) IMPLANT
BRUSH SUPERTRAX BIOPSY (INSTRUMENTS) ×3 IMPLANT
BRUSH SUPERTRAX NDL-TIP CYTO (INSTRUMENTS) ×3 IMPLANT
CANISTER SUCTION 2500CC (MISCELLANEOUS) ×6 IMPLANT
CHANNEL WORK EXTEND EDGE 180 (KITS) IMPLANT
CHANNEL WORK EXTEND EDGE 45 (KITS) IMPLANT
CHANNEL WORK EXTEND EDGE 90 (KITS) IMPLANT
CONT SPEC 4OZ CLIKSEAL STRL BL (MISCELLANEOUS) ×18 IMPLANT
COTTONBALL LRG STERILE PKG (GAUZE/BANDAGES/DRESSINGS) IMPLANT
COVER DOME SNAP 22 D (MISCELLANEOUS) ×3 IMPLANT
COVER TABLE BACK 60X90 (DRAPES) ×6 IMPLANT
FILTER STRAW FLUID ASPIR (MISCELLANEOUS) ×3 IMPLANT
FORCEPS BIOP RJ4 1.8 (CUTTING FORCEPS) IMPLANT
FORCEPS BIOP SUPERTRX PREMAR (INSTRUMENTS) ×3 IMPLANT
GAUZE SPONGE 4X4 12PLY STRL (GAUZE/BANDAGES/DRESSINGS) ×3 IMPLANT
GLOVE SURG SIGNA 7.5 PF LTX (GLOVE) ×6 IMPLANT
GOWN STRL REUS W/ TWL XL LVL3 (GOWN DISPOSABLE) ×2 IMPLANT
GOWN STRL REUS W/TWL XL LVL3 (GOWN DISPOSABLE) ×4
KIT CLEAN ENDO COMPLIANCE (KITS) ×9 IMPLANT
KIT PROCEDURE EDGE 180 (KITS) ×3 IMPLANT
KIT PROCEDURE EDGE 45 (KITS) IMPLANT
KIT PROCEDURE EDGE 90 (KITS) IMPLANT
KIT ROOM TURNOVER OR (KITS) ×6 IMPLANT
MARKER SKIN DUAL TIP RULER LAB (MISCELLANEOUS) ×6 IMPLANT
NEEDLE 22X1 1/2 (OR ONLY) (NEEDLE) IMPLANT
NEEDLE BIOPSY TRANSBRONCH 21G (NEEDLE) IMPLANT
NEEDLE BLUNT 18X1 FOR OR ONLY (NEEDLE) IMPLANT
NEEDLE EBUS SONO TIP PENTAX (NEEDLE) ×3 IMPLANT
NEEDLE SONO TIP II EBUS (NEEDLE) ×3 IMPLANT
NEEDLE SUPERTRX PREMARK BIOPSY (NEEDLE) ×3 IMPLANT
NS IRRIG 1000ML POUR BTL (IV SOLUTION) ×6 IMPLANT
OIL SILICONE PENTAX (PARTS (SERVICE/REPAIRS)) ×3 IMPLANT
PAD ARMBOARD 7.5X6 YLW CONV (MISCELLANEOUS) ×12 IMPLANT
PATCHES PATIENT (LABEL) ×9 IMPLANT
SYR 20CC LL (SYRINGE) ×6 IMPLANT
SYR 20ML ECCENTRIC (SYRINGE) ×9 IMPLANT
SYR 30ML LL (SYRINGE) ×3 IMPLANT
SYR 5ML LL (SYRINGE) ×6 IMPLANT
SYR 5ML LUER SLIP (SYRINGE) ×3 IMPLANT
SYR CONTROL 10ML LL (SYRINGE) IMPLANT
SYR TB 1ML LUER SLIP (SYRINGE) ×3 IMPLANT
SYSTEM GENCUT CORE BIOPSY (NEEDLE) ×3 IMPLANT
TOWEL OR 17X24 6PK STRL BLUE (TOWEL DISPOSABLE) ×6 IMPLANT
TRAP SPECIMEN MUCOUS 40CC (MISCELLANEOUS) ×6 IMPLANT
TUBE CONNECTING 20'X1/4 (TUBING) ×3
TUBE CONNECTING 20X1/4 (TUBING) ×6 IMPLANT
UNDERPAD 30X30 (UNDERPADS AND DIAPERS) ×3 IMPLANT
WATER STERILE IRR 1000ML POUR (IV SOLUTION) ×6 IMPLANT

## 2015-11-10 NOTE — Transfer of Care (Signed)
Immediate Anesthesia Transfer of Care Note  Patient: Christian Espinoza  Procedure(s) Performed: Procedure(s): VIDEO BRONCHOSCOPY WITH ENDOBRONCHIAL NAVIGATION (N/A) VIDEO BRONCHOSCOPY WITH ENDOBRONCHIAL ULTRASOUND (N/A)  Patient Location: PACU  Anesthesia Type:General  Level of Consciousness: awake  Airway & Oxygen Therapy: Patient Spontanous Breathing and Patient connected to face mask oxygen  Post-op Assessment: Report given to RN, Post -op Vital signs reviewed and stable and Patient moving all extremities X 4  Post vital signs: Reviewed and stable  Last Vitals:  Filed Vitals:   11/10/15 0559  BP: 122/71  Pulse: 61  Temp: 36.4 C  Resp: 18    Last Pain: There were no vitals filed for this visit.       Complications: No apparent anesthesia complications

## 2015-11-10 NOTE — Interval H&P Note (Signed)
History and Physical Interval Note:  11/10/2015 7:45 AM  Christian Espinoza  has presented today for surgery, with the diagnosis of LLL NODULE ADENOPATHY  The various methods of treatment have been discussed with the patient and family. After consideration of risks, benefits and other options for treatment, the patient has consented to  Procedure(s): VIDEO BRONCHOSCOPY WITH ENDOBRONCHIAL NAVIGATION (N/A) VIDEO BRONCHOSCOPY WITH ENDOBRONCHIAL ULTRASOUND (N/A) as a surgical intervention .  The patient's history has been reviewed, patient examined, no change in status, stable for surgery.  I have reviewed the patient's chart and labs.  Questions were answered to the patient's satisfaction.     Melrose Nakayama

## 2015-11-10 NOTE — H&P (View-Only) (Signed)
PCP is Pcp Not In System Referring Provider is Curt Bears, MD  Chief Complaint  Patient presents with  . Follow-up    after CT CHEST 10/25/15.Marland KitchenMarland Kitchenprevious bx nondx...resides at [facility]  . Lung Lesion    RML  . Lung Mass    LUL    HPI: Christian Espinoza is sent for consultation by Dr. Julien Nordmann for biopsy of a left lower lobe lung mass.  Christian Espinoza is a 70 year old gentleman who is a resident in an assisted living facility [facility]). He has a long history of tobacco abuse dating back about 50 years. He smoked about a pack a day during most of that time. He is currently smoking 1 cigarette a day. He has a history of COPD and hypertension. He also has a history of depression. He is a poor historian and I do not have any outside records for reference.  He was found to have a 3 cm left lung nodule on a CT scan. He saw Dr. Julien Nordmann. A PET CT showed hypermetabolic activity in the nodule and also in AP window, left hilar, and subcarinal lymph nodes. There was no evidence of distant metastases. A CT-guided biopsy was done which showed only necrotic material. No viable tumor cells were seen. He now is referred for consideration for bronchoscopic biopsy.  Zubrod Score: At the time of surgery this patient's most appropriate activity status/level should be described as: '[]'$     0    Normal activity, no symptoms '[]'$     1    Restricted in physical strenuous activity but ambulatory, able to do out light work '[x]'$     2    Ambulatory and capable of self care, unable to do work activities, up and about >50 % of waking hours                              '[]'$     3    Only limited self care, in bed greater than 50% of waking hours '[]'$     4    Completely disabled, no self care, confined to bed or chair '[]'$     5    Moribund   Past Medical History  Diagnosis Date  . HTN (hypertension)   . COPD (chronic obstructive pulmonary disease) ([place])   . Depression     No past surgical history on file.  Family History  Problem  Relation Age of Onset  . COPD Mother   . Cancer Father     Social History Social History  Substance Use Topics  . Smoking status: Heavy Tobacco Smoker -- 2.00 packs/day for 15 years    Types: Cigarettes  . Smokeless tobacco: None     Comment: SMOKES 1 CIG PER DAY  . Alcohol Use: No    Current Outpatient Prescriptions  Medication Sig Dispense Refill  . acetaminophen (TYLENOL) 325 MG tablet Take 650 mg by mouth. Reported on 10/13/2015    . albuterol (PROAIR HFA) 108 (90 Base) MCG/ACT inhaler Inhale 2 puffs into the lungs every 4 (four) hours as needed for wheezing or shortness of breath.     Marland Kitchen amLODipine (NORVASC) 5 MG tablet Take 5 mg by mouth daily.     . Artificial Tear Ointment (REFRESH LACRI-LUBE) OINT Place 1 application into the right eye every 4 (four) hours as needed (Right eye irritation).    . bisacodyl (DULCOLAX) 5 MG EC tablet Take 10 mg by mouth daily as needed  for moderate constipation.    Marland Kitchen buPROPion (WELLBUTRIN SR) 150 MG 12 hr tablet Take 150 mg by mouth daily.     Marland Kitchen gabapentin (NEURONTIN) 300 MG capsule Take 300 mg by mouth 2 (two) times daily.    . hydrOXYzine (ATARAX/VISTARIL) 25 MG tablet Take 25 mg by mouth every 6 (six) hours as needed.     Marland Kitchen ipratropium-albuterol (DUONEB) 0.5-2.5 (3) MG/3ML SOLN Take 3 mLs by nebulization every 6 (six) hours.    . Polyvinyl Alcohol-Povidone (REFRESH OP) Apply 1 drop to eye 2 (two) times daily.    . predniSONE (DELTASONE) 10 MG tablet Take 10 mg by mouth daily with breakfast.    . risperiDONE (RISPERDAL) 2 MG tablet Take 2 mg by mouth at bedtime.     . senna (SENOKOT) 8.6 MG tablet Take 1 tablet by mouth every evening.    . traZODone (DESYREL) 150 MG tablet Take 150 mg by mouth at bedtime.      No current facility-administered medications for this visit.    No Known Allergies  Review of Systems  Constitutional: Negative for fever, chills, activity change, appetite change, fatigue and unexpected weight change.  HENT:  Negative for trouble swallowing and voice change.   Respiratory: Positive for shortness of breath and wheezing. Negative for cough.   Cardiovascular: Negative for chest pain and leg swelling.  Gastrointestinal: Negative for blood in stool.       Heart burn  Genitourinary: Negative for dysuria and hematuria.  Neurological: Positive for tremors. Negative for seizures and syncope.  Hematological: Does not bruise/bleed easily.  Psychiatric/Behavioral: Positive for dysphoric mood.  All other systems reviewed and are negative.   BP 123/76 mmHg  Pulse 60  Resp 18  Ht '5\' 8"'$  (1.727 m)  Wt 176 lb (79.833 kg)  BMI 26.77 kg/m2  SpO2 98% Physical Exam  Constitutional: He is oriented to person, place, and time. He appears well-developed and well-nourished. No distress.  HENT:  Head: Normocephalic and atraumatic.  Mouth/Throat: No oropharyngeal exudate.  Eyes: Conjunctivae and EOM are normal. Pupils are equal, round, and reactive to light. No scleral icterus.  Neck: Neck supple. No thyromegaly present.  Cardiovascular: Normal rate, regular rhythm and normal heart sounds.  Exam reveals no gallop and no friction rub.   No murmur heard. Pulmonary/Chest: Effort normal. No respiratory distress. He has wheezes. He has no rales.  Abdominal: Soft. He exhibits no distension. There is no tenderness.  Musculoskeletal: Normal range of motion. He exhibits no edema.  Lymphadenopathy:    He has no cervical adenopathy.  Neurological: He is alert and oriented to person, place, and time. No cranial nerve deficit. He exhibits normal muscle tone.  Psychiatric:  flat affect  Vitals reviewed.    Diagnostic Tests: CT CHEST WITHOUT CONTRAST  TECHNIQUE: Multidetector CT imaging of the chest was performed following the standard protocol without IV contrast.  COMPARISON: 10/06/2015 and 09/26/2015  FINDINGS: The study is limited without IV contrast. Images of the thoracic inlet are unremarkable. Central  airways are patent. A precarinal lymph node measures 1 by 1.5 cm borderline enlarged. No significant hilar adenopathy is noted on this unenhanced scan. Heart size within normal limits. Atherosclerotic calcifications of coronary arteries. Atherosclerotic calcifications of thoracic aorta.  Images of the lung parenchyma shows again extensive emphysematous changes bilaterally. Again noted nodular spiculated mass in superior segment of left lower lobe measures 3.7 x 2.7 cm. There is slight progression in size from prior exam when measures 3.2 by 2.7 cm.  There is adjacent mild pleural retraction and small loculated pleural effusion. Minimal adjacent pleural thickening. Axial image 80 there is mild central interstitial thickening in the right lower lobe laterally. Axial image 81 there is 5 mm nodule in right middle lobe laterally new from prior exam.  There is mild peripheral interstitial prominence in right middle lobe and right lower lobe.  The visualized upper abdomen shows no adrenal gland mass. The unenhanced liver shows no biliary ductal dilatation. Unenhanced spleen is unremarkable. The visualized pancreas is atrophic. No calcified gallstones are noted within gallbladder.  Sagittal images of the spine shows mild degenerative changes thoracic spine. No destructive bony lesions are noted. Sagittal view of the sternum is unremarkable. No destructive rib lesions are identified.  IMPRESSION: 1. Again noted spiculated mass in superior segment of left lower lobe measures 3.7 x 2.7 cm. There is minimal progression in size from prior exam when measures 3.2 cm x 2.7 cm. There is mild adjacent pleural retraction and minimal loculated pleural fluid and pleural thickening. Please see axial image 50. The study is limited without IV contrast. 2. There is indeterminate 5 mm nodule in right middle lobe new from prior exam. 3. Again noted extensive emphysematous changes bilaterally. There  is some peripheral interstitial prominence in right middle lobe and right lower lobe. Some central interstitial prominence with streaky appearance in right lower lobe laterally axial image 80. Minimal pneumonitis cannot be excluded. 4. Borderline enlarged precarinal lymph node. 5. No evidence of bony metastatic disease. No hilar adenopathy is noted on this unenhanced scan.   Electronically Signed  By: Lahoma Crocker M.D.  On: 10/25/2015 12:38 NUCLEAR MEDICINE PET SKULL BASE TO THIGH  TECHNIQUE: 9.2 mCi F-18 FDG was injected intravenously. Full-ring PET imaging was performed from the skull base to thigh after the radiotracer. CT data was obtained and used for attenuation correction and anatomic localization.  FASTING BLOOD GLUCOSE: Value: 69 mg/dl  COMPARISON: Chest CT 08/31/2015.  FINDINGS: NECK  No hypermetabolic lymph nodes in the neck.  CHEST  2.8 cm spiculated nodule in the superior segment left lower lobe, involving the major fissure is markedly hypermetabolic with SUV max = 09.9. Central photopenia in this nodule is compatible with necrosis.  The 11 mm AP window lymph node measured on the previous study is now 12 mm in short axis and demonstrates hypermetabolic FDG accumulation with SUV max = 4.9. 11 mm subcarinal lymph node identified on the previous study is hypermetabolic today with SUV max = 4.2. There is hypermetabolism in the posterior left hilum although no lymph node can be identified at this location on today's CT scan without intravenous contrast material.  Coronary artery calcification is evident. No evidence of pericardial effusion.  ABDOMEN/PELVIS  No abnormal hypermetabolic activity within the liver, pancreas, adrenal glands, or spleen. No hypermetabolic lymph nodes in the abdomen or pelvis.  There is abdominal aortic atherosclerosis without aneurysm. No adrenal nodule or mass. Diverticular changes are noted in the left colon  without diverticulitis.  SKELETON  Bilateral inguinal hernias contain only fat. No evidence for hypermetabolic bone lesion to suggest osseous metastatic disease.  IMPRESSION: Markedly hypermetabolic 2.8 cm left lower lobe pulmonary nodule shows central necrosis. Imaging features are compatible with primary bronchogenic carcinoma.  Hypermetabolic AP window and subcarinal lymph nodes suggest metastatic disease. There is focal hypermetabolism in the left hilum although discrete lymph nodes are not evident on the CT scan without intravenous contrast material.   Electronically Signed  By: Misty Stanley M.D.  On:  09/26/2015 15:54  I personally reviewed the CT and PET/CT and concur with the findings noted above.  Impression: 70 year old man with a history of tobacco abuse and COPD who has a 3.7 cm mass in the left lung. This bridges the major fissure, but appears to arise from the superior segment of the left lower lobe. He does have hypermetabolic activity in hilar, AP window, and subcarinal nodes although the nodes themselves are not remarkable in size. The clinical picture is most consistent with a stage III lung cancer, but we do not yet have a diagnosis.  Given that he has had a failed CT-guided biopsy, my recommendation is navigational bronchoscopy and endobronchial ultrasound for diagnostic purposes. This will be done in the operating room under general anesthesia on an outpatient basis. I discussed the general nature of the procedure with the patient. He understands that it is diagnostic and not therapeutic. I reviewed the indications, risks, benefits, and alternatives. He understands the risks include those associated with general anesthesia, and include death, MI, stroke, bleeding, blood clots, pneumothorax, failure to make a diagnosis, as well as the possibility of other unforeseeable complications.  He accepts the risks and wishes to proceed.  Plan: Navigational  bronchoscopy and endobronchial ultrasound on Thursday, 11/10/2015  Christian Nakayama, MD [facility] [phone]

## 2015-11-10 NOTE — Brief Op Note (Signed)
11/10/2015  12:23 PM  PATIENT:  Christian Espinoza  70 y.o. male  PRE-OPERATIVE DIAGNOSIS:  LEFT LOWER LOBE NODULE, ADENOPATHY  POST-OPERATIVE DIAGNOSIS:  LEFT LOWER LOBE NODULE, ADENOPATHY-  NON-SMALL CELL CARCINOMA  PROCEDURE:  Procedure(s): VIDEO BRONCHOSCOPY WITH ENDOBRONCHIAL NAVIGATION (N/A) VIDEO BRONCHOSCOPY WITH ENDOBRONCHIAL ULTRASOUND (N/A)  SURGEON:  Surgeon(s) and Role:    * Melrose Nakayama, MD - Primary   ASSISTANTS: none   ANESTHESIA:   general  EBL:  Total I/O In: 1100 [I.V.:1100] Out: 1 [Blood:1]  BLOOD ADMINISTERED:none  DRAINS: none   LOCAL MEDICATIONS USED:  NONE  SPECIMEN:  Source of Specimen:  4R and 7 lymph nodes, LLL mass  DISPOSITION OF SPECIMEN:  PATHOLOGY  PLAN OF CARE: Discharge to home after PACU  PATIENT DISPOSITION:  PACU - hemodynamically stable.   Delay start of Pharmacological VTE agent (>24hrs) due to surgical blood loss or risk of bleeding: not applicable

## 2015-11-10 NOTE — Op Note (Signed)
NAME:  SIMSExavior, Kimmons                  ACCOUNT NO.:  [id number]  MEDICAL RECORD NO.:  [id number]  LOCATION:  [facility]                         FACILITY:  [facility]  PHYSICIAN:  Revonda Standard. Roxan Hockey, M.D.DATE OF BIRTH:  1945/10/23  DATE OF PROCEDURE:  11/09/2005 DATE OF DISCHARGE:  11/10/2015                              OPERATIVE REPORT   PREOPERATIVE DIAGNOSIS:  Left lung nodule and hilar mediastinal adenopathy.  POSTOPERATIVE DIAGNOSIS:  Non-small cell carcinoma, clinical stage IIIA.  PROCEDURE:  Electromagnetic navigational bronchoscopy with needle aspirations, brushings, biopsies, and GenCut biopsies.  Endobronchial ultrasound with mediastinal lymph node aspirations.  SURGEON:  Revonda Standard. Roxan Hockey, M.D.  ASSISTANT:  None.  ANESTHESIA:  General.  FINDINGS:  Aspirations of 4R were bloody, aspirations of level 7 node showed lymphoid cells, brushings of left lower lobe mass revealed non- small cell carcinoma.  CLINICAL NOTE:  Mr. Maule is a 70 year old gentleman with a history of tobacco abuse and COPD.  He recently was found to have a 3-cm left lung nodule on CT scan.  This was hypermetabolic on PET-CT.  There also were hypermetabolic lymph nodes in the AP window, left hilum, and subcarinal nodes.  He had undergone a CT-guided biopsy, which showed only necrotic material.  He was referred for bronchoscopic biopsy.  He was advised to undergo navigational bronchoscopy and endobronchial ultrasound for diagnostic and staging purposes.  The indications, risks, benefits, and alternatives were discussed in detail with the patient.  He understood there was no guarantee of a definitive diagnosis.  He accepted the risks and agreed to proceed.  OPERATIVE NOTE:  Mr. Gubser was brought to the operating room on November 09, 2005.  He had induction of general anesthesia and was intubated.  Flexible fiberoptic bronchoscopy was performed via the endotracheal tube.  It revealed normal endobronchial anatomy  with no endobronchial lesions to the level of the subsegmental bronchi.  There were minimal secretions.  The endobronchial ultrasound probe then was advanced.  Lymph nodes were identified in the 4R and 7 locations.  Aspirations were performed of each of these nodes.  Each node was aspirated twice.  The needle was advanced into the lymph node with ultrasound visualization.  Suction was applied and 10-12 passes were made with the needle.  The specimen was applied to slides and sent for cytology.  The bronchoscope was reinserted.  The locatable guide for navigation was advanced.  Registration was performed.  There was good correlation of the video and virtual bronchoscopy.  The bronchoscope then was navigated to the superior segmental bronchus of the left lower lobe and the locatable guide was advanced to within 1.5 cm of the lesion.  Initially, it was lined up on the edge of the lesion and sampling was performed in this area, beginning with needle aspirations, followed by needle brushings, then multiple biopsies were obtained.  The brushings and needle aspirations were sent for cytology.  The aspirations from the mediastinal nodes returned showing blood on the level 4R aspirations and lymphoid cells, but no tumor on the level 7 nodes.  The locatable guide then was used to reposition the sheath.  This was lined up more centrally with the mass.  The sampling process was repeated again with needle aspirations, brushings, biopsies, and then finally samples were obtained with the GenCut device.  The initial aspirations from the edge of the lesion were negative for tumor, showing only necrosis.  The brushings from more central in the lesion showed non-small cell carcinoma.  A final inspection was made with the bronchoscope.  There was no ongoing bleeding.  The patient then was extubated in the operating room and taken the postanesthetic care unit in good condition.     Revonda Standard  Roxan Hockey, M.D.     SCH/MEDQ  D:  11/10/2015  T:  11/10/2015  Job:  [id number]

## 2015-11-10 NOTE — Progress Notes (Signed)
Oncology Nurse Navigator Documentation  Oncology Nurse Navigator Flowsheets 11/10/2015  Navigator Location [facility]  Navigator Encounter Type Telephone  Telephone Outgoing Call  Treatment Phase Pre-Tx/Tx Discussion  Barriers/Navigation Needs Coordination of Care  Interventions Coordination of Care  Acuity Level 1  Time Spent with Patient 15   Per Dr. Roxan Hockey, I called patient's facility to arrange appt for [place] on 11/17/15 arrive at 12:30.  [facility] transportation service verbalized understanding of appt.

## 2015-11-10 NOTE — Anesthesia Postprocedure Evaluation (Signed)
Anesthesia Post Note  Patient: Christian Espinoza  Procedure(s) Performed: Procedure(s) (LRB): VIDEO BRONCHOSCOPY WITH ENDOBRONCHIAL NAVIGATION (N/A) VIDEO BRONCHOSCOPY WITH ENDOBRONCHIAL ULTRASOUND (N/A)  Patient location during evaluation: PACU Anesthesia Type: General Level of consciousness: awake and alert Pain management: pain level controlled Vital Signs Assessment: post-procedure vital signs reviewed and stable Respiratory status: spontaneous breathing, nonlabored ventilation, respiratory function stable and patient connected to nasal cannula oxygen Cardiovascular status: blood pressure returned to baseline and stable Postop Assessment: no signs of nausea or vomiting Anesthetic complications: no    Last Vitals:  Filed Vitals:   11/10/15 1013 11/10/15 1015  BP:  108/65  Pulse: 71 75  Temp:    Resp:  18    Last Pain: There were no vitals filed for this visit.               Ellissa Ayo,JAMES TERRILL

## 2015-11-10 NOTE — Discharge Instructions (Signed)
Do not drive or engage in heavy physical activity for 24 hours  You may resume normal activities tomorrow  You may cough up small amounts of blood over the next few days.  You may use over the counter cough medication if needed  My office will contact you with follow up information  Call [phone] if you develop chest pain, shortness of breath, cough up large amounts of blood( > 2 tablespoons) or have a fever > 101 F

## 2015-11-10 NOTE — Anesthesia Procedure Notes (Signed)
Procedure Name: Intubation Date/Time: 11/10/2015 7:59 AM Performed by: Neldon Newport Pre-anesthesia Checklist: Patient identified, Emergency Drugs available, Suction available, Patient being monitored and Timeout performed Patient Re-evaluated:Patient Re-evaluated prior to inductionOxygen Delivery Method: Simple face mask Preoxygenation: Pre-oxygenation with 100% oxygen Intubation Type: IV induction Ventilation: Mask ventilation without difficulty and Oral airway inserted - appropriate to patient size Laryngoscope Size: Mac and 3 Grade View: Grade I Tube type: Oral Tube size: 8.5 mm Number of attempts: 1 Airway Equipment and Method: Stylet and Oral airway Placement Confirmation: ETT inserted through vocal cords under direct vision,  breath sounds checked- equal and bilateral and positive ETCO2 Secured at: 21 cm Tube secured with: Tape Dental Injury: Teeth and Oropharynx as per pre-operative assessment

## 2015-11-11 ENCOUNTER — Encounter (HOSPITAL_COMMUNITY): Payer: Self-pay | Admitting: Thoracic Surgery (Cardiothoracic Vascular Surgery)

## 2015-11-11 LAB — ACID FAST SMEAR (AFB, MYCOBACTERIA): Acid Fast Smear: NEGATIVE

## 2015-11-11 LAB — ACID FAST SMEAR (AFB)

## 2015-11-15 LAB — AEROBIC/ANAEROBIC CULTURE W GRAM STAIN (SURGICAL/DEEP WOUND): Culture: NO GROWTH

## 2015-11-15 LAB — AEROBIC/ANAEROBIC CULTURE (SURGICAL/DEEP WOUND)

## 2015-11-16 ENCOUNTER — Telehealth: Payer: Self-pay | Admitting: *Deleted

## 2015-11-16 NOTE — Telephone Encounter (Signed)
Called and confirmed 2/29/17 clinic appt w/ pt.

## 2015-11-17 ENCOUNTER — Encounter: Payer: Self-pay | Admitting: *Deleted

## 2015-11-17 ENCOUNTER — Encounter: Payer: Self-pay | Admitting: Internal Medicine

## 2015-11-17 ENCOUNTER — Ambulatory Visit (HOSPITAL_BASED_OUTPATIENT_CLINIC_OR_DEPARTMENT_OTHER): Payer: Medicare PPO | Admitting: Internal Medicine

## 2015-11-17 ENCOUNTER — Ambulatory Visit
Admission: RE | Admit: 2015-11-17 | Discharge: 2015-11-17 | Disposition: A | Discharge status: Home / Self Care | Payer: Medicare PPO | Source: Ambulatory Visit | Attending: Radiation Oncology | Admitting: Radiation Oncology

## 2015-11-17 VITALS — BP 122/74 | HR 67 | Temp 97.6°F | Resp 18 | Ht 68.0 in | Wt 176.3 lb

## 2015-11-17 DIAGNOSIS — C3432 Malignant neoplasm of lower lobe, left bronchus or lung: Secondary | ICD-10-CM | POA: Diagnosis not present

## 2015-11-17 DIAGNOSIS — C3492 Malignant neoplasm of unspecified part of left bronchus or lung: Secondary | ICD-10-CM

## 2015-11-17 DIAGNOSIS — Z5111 Encounter for antineoplastic chemotherapy: Secondary | ICD-10-CM | POA: Insufficient documentation

## 2015-11-17 HISTORY — DX: Encounter for antineoplastic chemotherapy: Z51.11

## 2015-11-17 MED ORDER — PROCHLORPERAZINE MALEATE 10 MG PO TABS: 10.0000 mg | ORAL_TABLET | Freq: Four times a day (QID) | ORAL | Status: DC | PRN

## 2015-11-17 NOTE — Progress Notes (Signed)
[place] [phone]   [phone]) [phone] Multidisciplinary thoracic oncology clinic  OFFICE PROGRESS NOTE  Pcp Not In System No address on file  DIAGNOSIS: Stage IIIB (T1b, N3, M0) non-small cell lung cancer, squamous cell carcinoma diagnosed in July 2017 presented with left lower lobe lung mass in addition to suspicious mediastinal lymphadenopathy.  PRIOR THERAPY: None  CURRENT THERAPY: Concurrent chemoradiation with weekly carboplatin for AUC of 2 and paclitaxel 45 MG/M2. First dose 11/28/2015.  INTERVAL HISTORY: Christian Espinoza Christian Espinoza 70 y.o. male returns to the clinic today for follow-up visit accompanied by his caregiver. The patient is feeling fine today with no specific complaints except for shortness breath with exertion. He denied having any significant chest pain, shortness of breath but has mild cough with no hemoptysis. He has no significant weight loss or night sweats. He has no fever or chills, no nausea or vomiting. He recently underwent electromagnetic navigational bronchoscopy with needle aspiration, brushing and biopsies as well as endobronchial ultrasound with mediastinal lymph node aspiration under the care of Dr. Roxan Hockey on 11/10/2015. The final pathology of the left lower lobe biopsy was consistent with a squamous cell carcinoma. The fine-needle aspiration of the mediastinal lymph nodes were negative for malignancy. He is here today for evaluation and discussion of his treatment options.  MEDICAL HISTORY: Past Medical History  Diagnosis Date  . HTN (hypertension)   . COPD (chronic obstructive pulmonary disease) ([place])   . Depression   . Cataract   . Lung nodule     left lower lobe lung nodule, adenopathy  . Shortness of breath dyspnea     with exertion   . Anxiety     ALLERGIES:  has No Known Allergies.  MEDICATIONS:  Current Outpatient Prescriptions  Medication Sig Dispense Refill  . acetaminophen (TYLENOL) 325 MG tablet Take 650  mg by mouth. Reported on 10/13/2015    . albuterol (PROAIR HFA) 108 (90 Base) MCG/ACT inhaler Inhale 2 puffs into the lungs every 4 (four) hours as needed for wheezing or shortness of breath.     Marland Kitchen amLODipine (NORVASC) 5 MG tablet Take 5 mg by mouth daily.     . Artificial Tear Ointment (REFRESH LACRI-LUBE) OINT Place 1 application into the right eye every 4 (four) hours as needed (Right eye irritation).    . bisacodyl (DULCOLAX) 5 MG EC tablet Take 10 mg by mouth daily as needed for moderate constipation.    Marland Kitchen buPROPion (WELLBUTRIN SR) 150 MG 12 hr tablet Take 150 mg by mouth daily.     Marland Kitchen gabapentin (NEURONTIN) 300 MG capsule Take 300 mg by mouth 2 (two) times daily.    . hydrOXYzine (ATARAX/VISTARIL) 50 MG tablet Take 50 mg by mouth every 6 (six) hours as needed for anxiety.    Marland Kitchen ipratropium-albuterol (DUONEB) 0.5-2.5 (3) MG/3ML SOLN Take 3 mLs by nebulization every 6 (six) hours.    . Multiple Vitamins-Minerals (PRESERVISION AREDS) TABS Take 1 tablet by mouth 2 (two) times daily.    . Polyvinyl Alcohol-Povidone (REFRESH OP) Apply 1 drop to eye 2 (two) times daily.    . predniSONE (DELTASONE) 10 MG tablet Take 10 mg by mouth daily with breakfast.    . risperiDONE (RISPERDAL) 2 MG tablet Take 2 mg by mouth at bedtime.     . senna (SENOKOT) 8.6 MG tablet Take 1 tablet by mouth every evening.    . traMADol (ULTRAM) 50 MG tablet Take 50 mg by mouth every 12 (twelve) hours  as needed for moderate pain.    . traZODone (DESYREL) 150 MG tablet Take 150 mg by mouth at bedtime.      No current facility-administered medications for this visit.    SURGICAL HISTORY:  Past Surgical History  Procedure Laterality Date  . Appendectomy    . Eye surgery      cataracts removed  . Video bronchoscopy with endobronchial navigation N/A 11/10/2015    Procedure: VIDEO BRONCHOSCOPY WITH ENDOBRONCHIAL NAVIGATION;  Surgeon: Melrose Nakayama, MD;  Location: [facility];  Service: Thoracic;  Laterality: N/A;  . Video  bronchoscopy with endobronchial ultrasound N/A 11/10/2015    Procedure: VIDEO BRONCHOSCOPY WITH ENDOBRONCHIAL ULTRASOUND;  Surgeon: Melrose Nakayama, MD;  Location: [facility];  Service: Thoracic;  Laterality: N/A;    REVIEW OF SYSTEMS:  Constitutional: negative Eyes: negative Ears, nose, mouth, throat, and face: negative Respiratory: positive for dyspnea on exertion Cardiovascular: negative Gastrointestinal: negative Genitourinary:negative Integument/breast: negative Hematologic/lymphatic: negative Musculoskeletal:negative Neurological: negative Behavioral/Psych: negative Endocrine: negative Allergic/Immunologic: negative   PHYSICAL EXAMINATION: General appearance: alert, cooperative and no distress Head: Normocephalic, without obvious abnormality, atraumatic Neck: no adenopathy, no JVD, supple, symmetrical, trachea midline and thyroid not enlarged, symmetric, no tenderness/mass/nodules Lymph nodes: Cervical, supraclavicular, and axillary nodes normal. Resp: clear to auscultation bilaterally Back: symmetric, no curvature. ROM normal. No CVA tenderness. Cardio: regular rate and rhythm, S1, S2 normal, no murmur, click, rub or gallop GI: soft, non-tender; bowel sounds normal; no masses,  no organomegaly Extremities: extremities normal, atraumatic, no cyanosis or edema Neurologic: Alert and oriented X 3, normal strength and tone. Normal symmetric reflexes. Normal coordination and gait  ECOG PERFORMANCE STATUS: 1 - Symptomatic but completely ambulatory  Blood pressure 122/74, pulse 67, temperature 97.6 F (36.4 C), temperature source Oral, resp. rate 18, height '5\' 8"'$  (1.727 m), weight 176 lb 4.8 oz (79.969 kg), SpO2 92 %.  LABORATORY DATA: Lab Results  Component Value Date   WBC 12.7* 11/10/2015   HGB 14.4 11/10/2015   HCT 44.0 11/10/2015   MCV 87.1 11/10/2015   PLT 181 11/10/2015      Chemistry      Component Value Date/Time   NA 136 11/10/2015 0646   NA 139 10/13/2015  1350   K 3.9 11/10/2015 0646   K 4.5 10/13/2015 1350   CL 103 11/10/2015 0646   CO2 25 11/10/2015 0646   CO2 27 10/13/2015 1350   BUN 12 11/10/2015 0646   BUN 10.6 10/13/2015 1350   CREATININE 1.17 11/10/2015 0646   CREATININE 1.3 10/13/2015 1350      Component Value Date/Time   CALCIUM 8.9 11/10/2015 0646   CALCIUM 9.4 10/13/2015 1350   ALKPHOS 74 11/10/2015 0646   ALKPHOS 87 10/13/2015 1350   AST 17 11/10/2015 0646   AST 10 10/13/2015 1350   ALT 15* 11/10/2015 0646   ALT 12 10/13/2015 1350   BILITOT 0.5 11/10/2015 0646   BILITOT 0.42 10/13/2015 1350       RADIOGRAPHIC STUDIES: Dg Chest 2 View  11/10/2015  CLINICAL DATA:  70 year old male with left lung nodule. Preop radiograph. EXAM: CHEST  2 VIEW COMPARISON:  Chest CT dated 10/25/2015 FINDINGS: Two views of the chest demonstrate emphysematous changes of the lungs. There is no focal consolidation, pleural effusion, or pneumothorax. A 3.3 cm mass noted in the left mid lung field corresponding to the mass seen on the prior CT. There are areas of bibasilar scarring. The cardiac silhouette is within normal limits. No acute osseous pathology. IMPRESSION: No  acute cardiopulmonary process. Left lung mass. Emphysema. Electronically Signed   By: Anner Crete M.D.   On: 11/10/2015 06:36   Ct Chest Wo Contrast  10/25/2015  CLINICAL DATA:  Follow-up lung mass EXAM: CT CHEST WITHOUT CONTRAST TECHNIQUE: Multidetector CT imaging of the chest was performed following the standard protocol without IV contrast. COMPARISON:  10/06/2015 and 09/26/2015 FINDINGS: The study is limited without IV contrast. Images of the thoracic inlet are unremarkable. Central airways are patent. A precarinal lymph node measures 1 by 1.5 cm borderline enlarged. No significant hilar adenopathy is noted on this unenhanced scan. Heart size within normal limits. Atherosclerotic calcifications of coronary arteries. Atherosclerotic calcifications of thoracic aorta. Images of  the lung parenchyma shows again extensive emphysematous changes bilaterally. Again noted nodular spiculated mass in superior segment of left lower lobe measures 3.7 x 2.7 cm. There is slight progression in size from prior exam when measures 3.2 by 2.7 cm. There is adjacent mild pleural retraction and small loculated pleural effusion. Minimal adjacent pleural thickening. Axial image 80 there is mild central interstitial thickening in the right lower lobe laterally. Axial image 81 there is 5 mm nodule in right middle lobe laterally new from prior exam. There is mild peripheral interstitial prominence in right middle lobe and right lower lobe. The visualized upper abdomen shows no adrenal gland mass. The unenhanced liver shows no biliary ductal dilatation. Unenhanced spleen is unremarkable. The visualized pancreas is atrophic. No calcified gallstones are noted within gallbladder. Sagittal images of the spine shows mild degenerative changes thoracic spine. No destructive bony lesions are noted. Sagittal view of the sternum is unremarkable. No destructive rib lesions are identified. IMPRESSION: 1. Again noted spiculated mass in superior segment of left lower lobe measures 3.7 x 2.7 cm. There is minimal progression in size from prior exam when measures 3.2 cm x 2.7 cm. There is mild adjacent pleural retraction and minimal loculated pleural fluid and pleural thickening. Please see axial image 50. The study is limited without IV contrast. 2. There is indeterminate 5 mm nodule in right middle lobe new from prior exam. 3. Again noted extensive emphysematous changes bilaterally. There is some peripheral interstitial prominence in right middle lobe and right lower lobe. Some central interstitial prominence with streaky appearance in right lower lobe laterally axial image 80. Minimal pneumonitis cannot be excluded. 4. Borderline enlarged precarinal lymph node. 5. No evidence of bony metastatic disease. No hilar adenopathy is  noted on this unenhanced scan. Electronically Signed   By: Lahoma Crocker M.D.   On: 10/25/2015 12:38   Dg Chest Port 1 View  11/10/2015  CLINICAL DATA:  Status post endobronchial ultrasound today. Question pneumothorax. Pulmonary mass. EXAM: PORTABLE CHEST 1 VIEW COMPARISON:  PA and lateral chest 11/10/2015.  CT chest 10/25/2015. FINDINGS: No pneumothorax is identified after bronchoscopy. The lungs are emphysematous. Mass in the superior segment of the left lower lobe is again seen. Heart size is normal. IMPRESSION: Negative for pneumothorax. Left lower lobe mass as seen on prior exams. Emphysema. Electronically Signed   By: Inge Rise M.D.   On: 11/10/2015 10:31   Dg C-arm Bronchoscopy  11/10/2015  CLINICAL DATA:  C-ARM BRONCHOSCOPY Fluoroscopy was utilized by the requesting physician.  No radiographic interpretation.    ASSESSMENT AND PLAN: This is a very pleasant 70 years old white male with questionably stage IIIB non-small cell lung cancer, squamous cell carcinoma presenting with left lower lobe lung mass in addition to suspicious mediastinal lymphadenopathy. pending tissue diagnosis. I  had a lengthy discussion with the patient and his caregiver about his current disease stage, prognosis and treatment options. The patient is not a good surgical candidate for resection and he also has suspicious mediastinal lymphadenopathy. I recommended for the patient a course of concurrent chemoradiation with weekly carboplatin for AUC of 2 and paclitaxel 45 MG/M2. I discussed with the patient adverse effect of the chemotherapy including but not limited to alopecia, myelosuppression, nausea and vomiting, peripheral neuropathy, liver or renal dysfunction. I will arrange for the patient to have a chemotherapy education class before starting the first dose of his chemotherapy. He is expected to start the first dose of this treatment on 11/28/2015. The patient will be seen by Dr. Tammi Klippel later today for  evaluation and discussion of the radiotherapy option. He was seen during the multidisciplinary thoracic oncology clinic today by medical oncology, radiation oncology, thoracic navigator and social worker. He would come back for follow-up visit in 3 weeks for evaluation and management of any adverse effect of his treatment. I will call his pharmacy with prescription for Compazine 10 mg by mouth every 6 hours as needed for nausea. He was advised to call immediately if he has any concerning symptoms in the interval. The patient voices understanding of current disease status and treatment options and is in agreement with the current care plan.  All questions were answered. The patient knows to call the clinic with any problems, questions or concerns. We can certainly see the patient much sooner if necessary.  Disclaimer: This note was dictated with voice recognition software. Similar sounding words can inadvertently be transcribed and may not be corrected upon review.

## 2015-11-17 NOTE — Progress Notes (Signed)
Christian Espinoza  Clinical Social Espinoza met with patient and Chief Technology Officer from [employer].  Medical oncologist reviewed patient's plan of treatment.  The patient had no questions or concerns at this time.  Patient's friend/driver informed CSW she may be unable to transport patient to some of his radiation appointments.  CSW advised patient apply for SCAT- patient plans to call CSW if he would like to apply.   Polo Christian, MSW, LCSW, OSW-C Clinical Social Worker [facility] [phone]

## 2015-11-17 NOTE — Progress Notes (Signed)
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   HISTORY OF PRESENT ILLNESS: Christian Espinoza is a 70 y.o. male seen at the request of Dr. Roxan Hockey for a new diagnosis of lung cancer. He presented to his primary provider with cough and shortness of breath and the workup included a chest x-ray with revealed a questionable left lung nodule. A CT of the chest on April 12th, 2017 revealed scattered nodules and a dominant left lower lobe mass measuring 3cm with borderline adenopathy along the prevesicular and paratracheal regions. On 09/26/15 a PET scan was performed and the left lower lobe mass was hypermetabolic with an SUV of 18. The lymph nodes were hypermetabolic with an SUV of 5-4.6. There was also a left atypical nodule that was mildly hypermetabolic.  An MRI of the brain was performed and was negative for disease. And he subsequently underwent a needle biopsy under...this was non-diagnostic. A repeat chest CT was performed so he could undergo navigational bronchoscopy. The legion in the left lower lobe had increased in size to 2.7 by 3.7 cm and navigational bronchoscopy on 11/10/15 revealed squamous cell carcinoma in the brushings, the lymph nodes did not reveal malignancy. He comes today for further discussion of the role of chemotherapy and radiation.    PREVIOUS RADIATION THERAPY: No  PAST MEDICAL HISTORY:  Past Medical History  Diagnosis Date  . HTN (hypertension)   . COPD (chronic obstructive pulmonary disease) ([place])   . Depression   . Cataract   . Lung nodule     left lower lobe lung nodule, adenopathy  . Shortness of  breath dyspnea     with exertion   . Anxiety       PAST SURGICAL HISTORY: Past Surgical History  Procedure Laterality Date  . Appendectomy    . Eye surgery      cataracts removed  . Video bronchoscopy with endobronchial navigation N/A 11/10/2015    Procedure: VIDEO BRONCHOSCOPY WITH ENDOBRONCHIAL NAVIGATION;  Surgeon: Melrose Nakayama, MD;  Location: [facility];  Service: Thoracic;  Laterality: N/A;  . Video bronchoscopy with endobronchial ultrasound N/A 11/10/2015    Procedure: VIDEO BRONCHOSCOPY WITH ENDOBRONCHIAL ULTRASOUND;  Surgeon: Melrose Nakayama, MD;  Location: [facility];  Service: Thoracic;  Laterality: N/A;    FAMILY HISTORY:  Family History  Problem Relation Age of Onset  . COPD Mother   . Cancer Father     SOCIAL HISTORY:  Social History   Social History  . Marital Status: Widowed    Spouse Name: N/A  . Number of Children: N/A  . Years of Education: N/A   Occupational History  . Not on file.   Social History Main Topics  . Smoking status: Heavy Tobacco Smoker -- 2.00 packs/day for 15 years    Types: Cigarettes  . Smokeless tobacco: Not on file     Comment: SMOKES 1 CIG PER DAY  . Alcohol Use: No  . Drug Use: No  . Sexual Activity: Not on file   Other Topics Concern  . Not on file   Social History Narrative    ALLERGIES: Review of patient's allergies indicates no known  allergies.  MEDICATIONS:  Current Outpatient Prescriptions  Medication Sig Dispense Refill  . acetaminophen (TYLENOL) 325 MG tablet Take 650 mg by mouth. Reported on 10/13/2015    . albuterol (PROAIR HFA) 108 (90 Base) MCG/ACT inhaler Inhale 2 puffs into the lungs every 4 (four) hours as needed for wheezing or shortness of breath.     Marland Kitchen amLODipine (NORVASC) 5 MG tablet Take 5 mg by mouth daily.     . Artificial Tear Ointment (REFRESH LACRI-LUBE) OINT Place 1 application into the right eye every 4 (four) hours as needed (Right eye irritation).    . bisacodyl (DULCOLAX) 5 MG EC tablet  Take 10 mg by mouth daily as needed for moderate constipation.    Marland Kitchen buPROPion (WELLBUTRIN SR) 150 MG 12 hr tablet Take 150 mg by mouth daily.     Marland Kitchen gabapentin (NEURONTIN) 300 MG capsule Take 300 mg by mouth 2 (two) times daily.    . hydrOXYzine (ATARAX/VISTARIL) 50 MG tablet Take 50 mg by mouth every 6 (six) hours as needed for anxiety.    Marland Kitchen ipratropium-albuterol (DUONEB) 0.5-2.5 (3) MG/3ML SOLN Take 3 mLs by nebulization every 6 (six) hours.    . Multiple Vitamins-Minerals (PRESERVISION AREDS) TABS Take 1 tablet by mouth 2 (two) times daily.    . Polyvinyl Alcohol-Povidone (REFRESH OP) Apply 1 drop to eye 2 (two) times daily.    . predniSONE (DELTASONE) 10 MG tablet Take 10 mg by mouth daily with breakfast.    . risperiDONE (RISPERDAL) 2 MG tablet Take 2 mg by mouth at bedtime.     . senna (SENOKOT) 8.6 MG tablet Take 1 tablet by mouth every evening.    . traMADol (ULTRAM) 50 MG tablet Take 50 mg by mouth every 12 (twelve) hours as needed for moderate pain.    . traZODone (DESYREL) 150 MG tablet Take 150 mg by mouth at bedtime.      No current facility-administered medications for this encounter.    REVIEW OF SYSTEMS:  On review of systems, the patient reports that he is doing well overall. He describes shortness of breath with exhertion that has been his baseline for years. He has a history of COPD managed by his PCP who is the physician who oversees the medical care at the assisted living facility he resides in. He denies any chest pain, cough, fevers, chills, night sweats, unintended weight changes. He denies any bowel or bladder disturbances, and denies abdominal pain, nausea or vomiting. He denies any new musculoskeletal or joint aches or pains. A complete review of systems is obtained and is otherwise negative.    PHYSICAL EXAM:   Pain Scale 0/10 In general this is a well appearing he in no acute distress. he is alert and oriented x4 and appropriate throughout the examination. HEENT  reveals that the patient is normocephalic, atraumatic. EOMs are intact. PERRLA. Skin is intact without any evidence of gross lesions. Cardiovascular exam reveals a regular rate and rhythm, no clicks rubs or murmurs are auscultated. Chest is clear to auscultation on the right, on the left apex there are expository wheezes. Lymphatic assessment is performed and does not reveal any adenopathy in the cervical, supraclavicular, axillary, or inguinal chains. Abdomen has active bowel sounds in all quadrants and is intact. The abdomen is soft, non tender, non distended. Lower extremities are negative for pretibial pitting edema, deep calf tenderness, cyanosis or clubbing. The patient reports shortness of breath that ensues during physical activity. Patient denies fevers, chills, or weight loss.  Patient denies abdomen pain but reports that he suffers from constipation that is managed with medication.    KPS = 90  100 - Normal; no complaints; no evidence of disease. 90   - Able to carry on normal activity; minor signs or symptoms of disease. 80   - Normal activity with effort; some signs or symptoms of disease. 31   - Cares for self; unable to carry on normal activity or to do active work. 60   - Requires occasional assistance, but is able to care for most of his personal needs. 50   - Requires considerable assistance and frequent medical care. 56   - Disabled; requires special care and assistance. 58   - Severely disabled; hospital admission is indicated although death not imminent. 58   - Very sick; hospital admission necessary; active supportive treatment necessary. 10   - Moribund; fatal processes progressing rapidly. 0     - Dead  Karnofsky DA, Abelmann [place], Craver LS and [place] [facility] [phone]) The use of the nitrogen mustards in the palliative treatment of carcinoma: with particular reference to bronchogenic carcinoma Cancer 1 [id number]  LABORATORY DATA:  Lab Results  Component Value Date   WBC 12.7*  11/10/2015   HGB 14.4 11/10/2015   HCT 44.0 11/10/2015   MCV 87.1 11/10/2015   PLT 181 11/10/2015   Lab Results  Component Value Date   NA 136 11/10/2015   K 3.9 11/10/2015   CL 103 11/10/2015   CO2 25 11/10/2015   Lab Results  Component Value Date   ALT 15* 11/10/2015   AST 17 11/10/2015   ALKPHOS 74 11/10/2015   BILITOT 0.5 11/10/2015     RADIOGRAPHY: Dg Chest 2 View  11/10/2015  CLINICAL DATA:  70 year old male with left lung nodule. Preop radiograph. EXAM: CHEST  2 VIEW COMPARISON:  Chest CT dated 10/25/2015 FINDINGS: Two views of the chest demonstrate emphysematous changes of the lungs. There is no focal consolidation, pleural effusion, or pneumothorax. A 3.3 cm mass noted in the left mid lung field corresponding to the mass seen on the prior CT. There are areas of bibasilar scarring. The cardiac silhouette is within normal limits. No acute osseous pathology. IMPRESSION: No acute cardiopulmonary process. Left lung mass. Emphysema. Electronically Signed   By: Anner Crete M.D.   On: 11/10/2015 06:36   Ct Chest Wo Contrast  10/25/2015  CLINICAL DATA:  Follow-up lung mass EXAM: CT CHEST WITHOUT CONTRAST TECHNIQUE: Multidetector CT imaging of the chest was performed following the standard protocol without IV contrast. COMPARISON:  10/06/2015 and 09/26/2015 FINDINGS: The study is limited without IV contrast. Images of the thoracic inlet are unremarkable. Central airways are patent. A precarinal lymph node measures 1 by 1.5 cm borderline enlarged. No significant hilar adenopathy is noted on this unenhanced scan. Heart size within normal limits. Atherosclerotic calcifications of coronary arteries. Atherosclerotic calcifications of thoracic aorta. Images of the lung parenchyma shows again extensive emphysematous changes bilaterally. Again noted nodular spiculated mass in superior segment of left lower lobe measures 3.7 x 2.7 cm. There is slight progression in size from prior exam when  measures 3.2 by 2.7 cm. There is adjacent mild pleural retraction and small loculated pleural effusion. Minimal adjacent pleural thickening. Axial image 80 there is mild central interstitial thickening in the right lower lobe laterally. Axial image 81 there is 5 mm nodule in right middle lobe laterally new from prior exam. There is mild peripheral interstitial prominence in right middle lobe and right  lower lobe. The visualized upper abdomen shows no adrenal gland mass. The unenhanced liver shows no biliary ductal dilatation. Unenhanced spleen is unremarkable. The visualized pancreas is atrophic. No calcified gallstones are noted within gallbladder. Sagittal images of the spine shows mild degenerative changes thoracic spine. No destructive bony lesions are noted. Sagittal view of the sternum is unremarkable. No destructive rib lesions are identified. IMPRESSION: 1. Again noted spiculated mass in superior segment of left lower lobe measures 3.7 x 2.7 cm. There is minimal progression in size from prior exam when measures 3.2 cm x 2.7 cm. There is mild adjacent pleural retraction and minimal loculated pleural fluid and pleural thickening. Please see axial image 50. The study is limited without IV contrast. 2. There is indeterminate 5 mm nodule in right middle lobe new from prior exam. 3. Again noted extensive emphysematous changes bilaterally. There is some peripheral interstitial prominence in right middle lobe and right lower lobe. Some central interstitial prominence with streaky appearance in right lower lobe laterally axial image 80. Minimal pneumonitis cannot be excluded. 4. Borderline enlarged precarinal lymph node. 5. No evidence of bony metastatic disease. No hilar adenopathy is noted on this unenhanced scan. Electronically Signed   By: Lahoma Crocker M.D.   On: 10/25/2015 12:38   Dg Chest Port 1 View  11/10/2015  CLINICAL DATA:  Status post endobronchial ultrasound today. Question pneumothorax. Pulmonary mass.  EXAM: PORTABLE CHEST 1 VIEW COMPARISON:  PA and lateral chest 11/10/2015.  CT chest 10/25/2015. FINDINGS: No pneumothorax is identified after bronchoscopy. The lungs are emphysematous. Mass in the superior segment of the left lower lobe is again seen. Heart size is normal. IMPRESSION: Negative for pneumothorax. Left lower lobe mass as seen on prior exams. Emphysema. Electronically Signed   By: Inge Rise M.D.   On: 11/10/2015 10:31   Dg C-arm Bronchoscopy  11/10/2015  CLINICAL DATA:  C-ARM BRONCHOSCOPY Fluoroscopy was utilized by the requesting physician.  No radiographic interpretation.      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    PLAN: The patient met today with Dr. Tammi Klippel who outlined the rationale for radiotherapy. Dr. Tammi Klippel discussed the utilization of chemotherapy in conjunction with radiation, and the patient is interested in moving forward. He is to receive taxol/carboplatin, and will begin simulation tomorrow in anticipation of starting chemotherapy and radiation the week of 11/28/15. Dr. Tammi Klippel outlines the rationale for 66 Gy to the left lower lobe over about 6 weeks. The patient states agreement and understanding. We discussed the risks, benefits, short, and long term effects of radiotherapy. He is interested in moving forward with treatment.   The above documentation reflects my direct findings during this shared patient visit. Please see the separate note by Dr. Tammi Klippel on this date for the remainder of the patient's plan of care.   Carola Rhine, PAC     This document serves as a record of services personally performed by [name], MD. It was created on his behalf by Truddie Hidden, a trained medical scribe. The creation of this record is based on the scribe's personal observations and the provider's statements to them. This document has been checked and approved by the attending  provider.

## 2015-11-18 ENCOUNTER — Telehealth: Payer: Self-pay | Admitting: *Deleted

## 2015-11-18 ENCOUNTER — Ambulatory Visit
Admission: RE | Admit: 2015-11-18 | Discharge: 2015-11-18 | Disposition: A | Discharge status: Home / Self Care | Payer: Medicare PPO | Source: Ambulatory Visit | Attending: Radiation Oncology | Admitting: Radiation Oncology

## 2015-11-18 VITALS — BP 121/79 | HR 62 | Temp 97.8°F | Resp 20 | Ht 68.0 in | Wt 177.1 lb

## 2015-11-18 DIAGNOSIS — C3432 Malignant neoplasm of lower lobe, left bronchus or lung: Secondary | ICD-10-CM | POA: Diagnosis not present

## 2015-11-18 DIAGNOSIS — Z510 Encounter for antineoplastic radiation therapy: Secondary | ICD-10-CM | POA: Diagnosis present

## 2015-11-18 DIAGNOSIS — C3492 Malignant neoplasm of unspecified part of left bronchus or lung: Secondary | ICD-10-CM

## 2015-11-18 DIAGNOSIS — R918 Other nonspecific abnormal finding of lung field: Secondary | ICD-10-CM

## 2015-11-18 NOTE — Telephone Encounter (Signed)
Per staff message and POF I have scheduled appts. Advised scheduler of appts. No available on 7/10 due to other appts  JMW

## 2015-11-18 NOTE — Progress Notes (Signed)
  Radiation Oncology         [phone] ________________________________  Name: Christian Espinoza MRN: [id number]  Date: 11/18/2015  DOB: 02-03-1946  SIMULATION AND TREATMENT PLANNING NOTE    ICD-9-CM ICD-10-CM   1. 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  Identity was confirmed.  All relevant records and images related to the planned course of therapy were reviewed.  The patient freely provided informed written consent to proceed with treatment after reviewing the details related to the planned course of therapy. The consent form was witnessed and verified by the simulation staff.  Then, the patient was set-up in a stable reproducible  supine position for radiation therapy.  CT images were obtained.  Surface markings were placed.  The CT images were loaded into the planning software.  Then the target and avoidance structures were contoured.  Treatment planning then occurred.  The radiation prescription was entered and confirmed.  Then, I designed and supervised the construction of a total of 6 medically necessary complex treatment devices, including a BodyFix immobilization mold custom fitted to the patient along with 5 multileaf collimators conformally shaped radiation around the treatment target while shielding critical structures such as the heart and spinal cord maximally.  I have requested : 3D Simulation  I have requested a DVH of the following structures: Left lung, right lung, spinal cord, heart, esophagus, and target.  I have ordered:Nutrition Consult  SPECIAL TREATMENT PROCEDURE:  The planned course of therapy using radiation constitutes a special treatment procedure. Special care is required in the management of this patient for the following reasons.  The  patient will be receiving concurrent chemotherapy requiring careful monitoring for increased toxicities of treatment including periodic laboratory values.  The special nature of the planned course of radiotherapy will require increased physician supervision and oversight to ensure patient's safety with optimal treatment outcomes.  PLAN:  The patient will receive 66 Gy in 33 fractions.  ________________________________  Sheral Apley Tammi Klippel, M.D.

## 2015-11-21 ENCOUNTER — Telehealth: Payer: Self-pay | Admitting: Internal Medicine

## 2015-11-21 ENCOUNTER — Telehealth: Payer: Self-pay | Admitting: *Deleted

## 2015-11-21 NOTE — Telephone Encounter (Signed)
s.w. pt and advised on July appt...Marland KitchenMarland Kitchen

## 2015-11-21 NOTE — Telephone Encounter (Signed)
Per staff message and POF I have scheduled appts. Advised scheduler of appts. JMW  

## 2015-11-24 ENCOUNTER — Other Ambulatory Visit: Payer: Medicare PPO

## 2015-11-24 DIAGNOSIS — Z510 Encounter for antineoplastic radiation therapy: Secondary | ICD-10-CM | POA: Diagnosis not present

## 2015-11-28 ENCOUNTER — Other Ambulatory Visit (HOSPITAL_BASED_OUTPATIENT_CLINIC_OR_DEPARTMENT_OTHER): Payer: Medicare PPO

## 2015-11-28 ENCOUNTER — Encounter (HOSPITAL_COMMUNITY): Payer: Self-pay

## 2015-11-28 ENCOUNTER — Ambulatory Visit
Admission: RE | Admit: 2015-11-28 | Discharge: 2015-11-28 | Disposition: A | Discharge status: Home / Self Care | Payer: Medicare PPO | Source: Ambulatory Visit | Attending: Radiation Oncology | Admitting: Radiation Oncology

## 2015-11-28 ENCOUNTER — Ambulatory Visit (HOSPITAL_BASED_OUTPATIENT_CLINIC_OR_DEPARTMENT_OTHER): Payer: Medicare PPO

## 2015-11-28 ENCOUNTER — Other Ambulatory Visit: Payer: Medicare PPO

## 2015-11-28 VITALS — BP 145/85 | HR 67 | Temp 98.0°F | Resp 18

## 2015-11-28 DIAGNOSIS — Z510 Encounter for antineoplastic radiation therapy: Secondary | ICD-10-CM | POA: Diagnosis not present

## 2015-11-28 DIAGNOSIS — R918 Other nonspecific abnormal finding of lung field: Secondary | ICD-10-CM

## 2015-11-28 DIAGNOSIS — C3492 Malignant neoplasm of unspecified part of left bronchus or lung: Secondary | ICD-10-CM

## 2015-11-28 DIAGNOSIS — C3432 Malignant neoplasm of lower lobe, left bronchus or lung: Secondary | ICD-10-CM

## 2015-11-28 DIAGNOSIS — Z5111 Encounter for antineoplastic chemotherapy: Secondary | ICD-10-CM

## 2015-11-28 LAB — COMPREHENSIVE METABOLIC PANEL
ALBUMIN: 3.6 g/dL (ref 3.5–5.0)
ALK PHOS: 94 U/L (ref 40–150)
ALT: 15 U/L (ref 0–55)
ANION GAP: 9 meq/L (ref 3–11)
AST: 13 U/L (ref 5–34)
BILIRUBIN TOTAL: 0.33 mg/dL (ref 0.20–1.20)
BUN: 8.2 mg/dL (ref 7.0–26.0)
CO2: 28 meq/L (ref 22–29)
Calcium: 9.3 mg/dL (ref 8.4–10.4)
Chloride: 100 mEq/L (ref 98–109)
Creatinine: 1.1 mg/dL (ref 0.7–1.3)
EGFR: 67 mL/min/{1.73_m2} — AB (ref >90)
Glucose: 76 mg/dl (ref 70–140)
Potassium: 3.9 mEq/L (ref 3.5–5.1)
Sodium: 138 mEq/L (ref 136–145)
TOTAL PROTEIN: 6.9 g/dL (ref 6.4–8.3)

## 2015-11-28 LAB — CBC WITH DIFFERENTIAL/PLATELET
BASO%: 0.6 % (ref 0.0–2.0)
BASOS ABS: 0.1 10*3/uL (ref 0.0–0.1)
EOS ABS: 0.3 10*3/uL (ref 0.0–0.5)
EOS%: 3.6 % (ref 0.0–7.0)
HCT: 45.8 % (ref 38.4–49.9)
HGB: 15.7 g/dL (ref 13.0–17.1)
LYMPH%: 24.3 % (ref 14.0–49.0)
MCH: 29.3 pg (ref 27.2–33.4)
MCHC: 34.3 g/dL (ref 32.0–36.0)
MCV: 85.4 fL (ref 79.3–98.0)
MONO#: 0.6 10*3/uL (ref 0.1–0.9)
MONO%: 7.6 % (ref 0.0–14.0)
NEUT%: 63.9 % (ref 39.0–75.0)
NEUTROS ABS: 5.4 10*3/uL (ref 1.5–6.5)
PLATELETS: 198 10*3/uL (ref 140–400)
RBC: 5.36 10*6/uL (ref 4.20–5.82)
RDW: 13.1 % (ref 11.0–14.6)
WBC: 8.4 10*3/uL (ref 4.0–10.3)
lymph#: 2.1 10*3/uL (ref 0.9–3.3)

## 2015-11-28 MED ORDER — SODIUM CHLORIDE 0.9 % IV SOLN
20.0000 mg | Freq: Once | INTRAVENOUS | Status: AC
  Administered 2015-11-28: 20 mg via INTRAVENOUS
  Filled 2015-11-28: qty 2

## 2015-11-28 MED ORDER — DIPHENHYDRAMINE HCL 50 MG/ML IJ SOLN
50.0000 mg | Freq: Once | INTRAMUSCULAR | Status: AC
  Administered 2015-11-28: 50 mg via INTRAVENOUS

## 2015-11-28 MED ORDER — SODIUM CHLORIDE 0.9 % IV SOLN
Freq: Once | INTRAVENOUS | Status: AC
  Administered 2015-11-28: 09:00:00 via INTRAVENOUS

## 2015-11-28 MED ORDER — FAMOTIDINE IN NACL 20-0.9 MG/50ML-% IV SOLN
20.0000 mg | Freq: Once | INTRAVENOUS | Status: AC
  Administered 2015-11-28: 20 mg via INTRAVENOUS

## 2015-11-28 MED ORDER — SODIUM CHLORIDE 0.9 % IV SOLN
184.8000 mg | Freq: Once | INTRAVENOUS | Status: AC
  Administered 2015-11-28: 180 mg via INTRAVENOUS
  Filled 2015-11-28: qty 18

## 2015-11-28 MED ORDER — FAMOTIDINE IN NACL 20-0.9 MG/50ML-% IV SOLN
INTRAVENOUS | Status: AC
  Filled 2015-11-28: qty 50

## 2015-11-28 MED ORDER — PALONOSETRON HCL INJECTION 0.25 MG/5ML
INTRAVENOUS | Status: AC
  Filled 2015-11-28: qty 5

## 2015-11-28 MED ORDER — PALONOSETRON HCL INJECTION 0.25 MG/5ML
0.2500 mg | Freq: Once | INTRAVENOUS | Status: AC
  Administered 2015-11-28: 0.25 mg via INTRAVENOUS

## 2015-11-28 MED ORDER — SODIUM CHLORIDE 0.9 % IV SOLN
45.0000 mg/m2 | Freq: Once | INTRAVENOUS | Status: AC
  Administered 2015-11-28: 90 mg via INTRAVENOUS
  Filled 2015-11-28: qty 15

## 2015-11-28 MED ORDER — DIPHENHYDRAMINE HCL 50 MG/ML IJ SOLN
INTRAMUSCULAR | Status: AC
  Filled 2015-11-28: qty 1

## 2015-11-28 NOTE — Patient Instructions (Signed)
[facility] Discharge Instructions for Patients Receiving Chemotherapy  Today you received the following chemotherapy agents taxol/carboplatin  To help prevent nausea and vomiting after your treatment, we encourage you to take your nausea medication as directed   If you develop nausea and vomiting that is not controlled by your nausea medication, call the clinic.   BELOW ARE SYMPTOMS THAT SHOULD BE REPORTED IMMEDIATELY:  *FEVER GREATER THAN 100.5 F  *CHILLS WITH OR WITHOUT FEVER  NAUSEA AND VOMITING THAT IS NOT CONTROLLED WITH YOUR NAUSEA MEDICATION  *UNUSUAL SHORTNESS OF BREATH  *UNUSUAL BRUISING OR BLEEDING  TENDERNESS IN MOUTH AND THROAT WITH OR WITHOUT PRESENCE OF ULCERS  *URINARY PROBLEMS  *BOWEL PROBLEMS  UNUSUAL RASH Items with * indicate a potential emergency and should be followed up as soon as possible.  Feel free to call the clinic you have any questions or concerns. The clinic phone number is [phone].  Paclitaxel injection What is this medicine? PACLITAXEL (PAK li TAX el) is a chemotherapy drug. It targets fast dividing cells, like cancer cells, and causes these cells to die. This medicine is used to treat ovarian cancer, breast cancer, and other cancers. This medicine may be used for other purposes; ask your health care provider or pharmacist if you have questions. What should I tell my health care provider before I take this medicine? They need to know if you have any of these conditions: -blood disorders -irregular heartbeat -infection (especially a virus infection such as chickenpox, cold sores, or herpes) -liver disease -previous or ongoing radiation therapy -an unusual or allergic reaction to paclitaxel, alcohol, polyoxyethylated castor oil, other chemotherapy agents, other medicines, foods, dyes, or preservatives -pregnant or trying to get pregnant -breast-feeding How should I use this medicine? This drug is given as an infusion  into a vein. It is administered in a hospital or clinic by a specially trained health care professional. Talk to your pediatrician regarding the use of this medicine in children. Special care may be needed. Overdosage: If you think you have taken too much of this medicine contact a poison control center or emergency room at once. NOTE: This medicine is only for you. Do not share this medicine with others. What if I miss a dose? It is important not to miss your dose. Call your doctor or health care professional if you are unable to keep an appointment. What may interact with this medicine? Do not take this medicine with any of the following medications: -disulfiram -metronidazole This medicine may also interact with the following medications: -cyclosporine -diazepam -ketoconazole -medicines to increase blood counts like filgrastim, pegfilgrastim, sargramostim -other chemotherapy drugs like cisplatin, doxorubicin, epirubicin, etoposide, teniposide, vincristine -quinidine -testosterone -vaccines -verapamil Talk to your doctor or health care professional before taking any of these medicines: -acetaminophen -aspirin -ibuprofen -ketoprofen -naproxen This list may not describe all possible interactions. Give your health care provider a list of all the medicines, herbs, non-prescription drugs, or dietary supplements you use. Also tell them if you smoke, drink alcohol, or use illegal drugs. Some items may interact with your medicine. What should I watch for while using this medicine? Your condition will be monitored carefully while you are receiving this medicine. You will need important blood work done while you are taking this medicine. This drug may make you feel generally unwell. This is not uncommon, as chemotherapy can affect healthy cells as well as cancer cells. Report any side effects. Continue your course of treatment even though you feel ill unless  your doctor tells you to stop. This  medicine can cause serious allergic reactions. To reduce your risk you will need to take other medicine(s) before treatment with this medicine. In some cases, you may be given additional medicines to help with side effects. Follow all directions for their use. Call your doctor or health care professional for advice if you get a fever, chills or sore throat, or other symptoms of a cold or flu. Do not treat yourself. This drug decreases your body's ability to fight infections. Try to avoid being around people who are sick. This medicine may increase your risk to bruise or bleed. Call your doctor or health care professional if you notice any unusual bleeding. Be careful brushing and flossing your teeth or using a toothpick because you may get an infection or bleed more easily. If you have any dental work done, tell your dentist you are receiving this medicine. Avoid taking products that contain aspirin, acetaminophen, ibuprofen, naproxen, or ketoprofen unless instructed by your doctor. These medicines may hide a fever. Do not become pregnant while taking this medicine. Women should inform their doctor if they wish to become pregnant or think they might be pregnant. There is a potential for serious side effects to an unborn child. Talk to your health care professional or pharmacist for more information. Do not breast-feed an infant while taking this medicine. Men are advised not to father a child while receiving this medicine. This product may contain alcohol. Ask your pharmacist or healthcare provider if this medicine contains alcohol. Be sure to tell all healthcare providers you are taking this medicine. Certain medicines, like metronidazole and disulfiram, can cause an unpleasant reaction when taken with alcohol. The reaction includes flushing, headache, nausea, vomiting, sweating, and increased thirst. The reaction can last from 30 minutes to several hours. What side effects may I notice from receiving this  medicine? Side effects that you should report to your doctor or health care professional as soon as possible: -allergic reactions like skin rash, itching or hives, swelling of the face, lips, or tongue -low blood counts - This drug may decrease the number of white blood cells, red blood cells and platelets. You may be at increased risk for infections and bleeding. -signs of infection - fever or chills, cough, sore throat, pain or difficulty passing urine -signs of decreased platelets or bleeding - bruising, pinpoint red spots on the skin, black, tarry stools, nosebleeds -signs of decreased red blood cells - unusually weak or tired, fainting spells, lightheadedness -breathing problems -chest pain -high or low blood pressure -mouth sores -nausea and vomiting -pain, swelling, redness or irritation at the injection site -pain, tingling, numbness in the hands or feet -slow or irregular heartbeat -swelling of the ankle, feet, hands Side effects that usually do not require medical attention (report to your doctor or health care professional if they continue or are bothersome): -bone pain -complete hair loss including hair on your head, underarms, pubic hair, eyebrows, and eyelashes -changes in the color of fingernails -diarrhea -loosening of the fingernails -loss of appetite -muscle or joint pain -red flush to skin -sweating This list may not describe all possible side effects. Call your doctor for medical advice about side effects. You may report side effects to FDA at 1-800-FDA-1088. Where should I keep my medicine? This drug is given in a hospital or clinic and will not be stored at home. NOTE: This sheet is a summary. It may not cover all possible information. If you have questions  about this medicine, talk to your doctor, pharmacist, or health care provider.   Carboplatin injection What is this medicine? CARBOPLATIN (KAR boe pla tin) is a chemotherapy drug. It targets fast dividing  cells, like cancer cells, and causes these cells to die. This medicine is used to treat ovarian cancer and many other cancers. This medicine may be used for other purposes; ask your health care provider or pharmacist if you have questions. What should I tell my health care provider before I take this medicine? They need to know if you have any of these conditions: -blood disorders -hearing problems -kidney disease -recent or ongoing radiation therapy -an unusual or allergic reaction to carboplatin, cisplatin, other chemotherapy, other medicines, foods, dyes, or preservatives -pregnant or trying to get pregnant -breast-feeding How should I use this medicine? This drug is usually given as an infusion into a vein. It is administered in a hospital or clinic by a specially trained health care professional. Talk to your pediatrician regarding the use of this medicine in children. Special care may be needed. Overdosage: If you think you have taken too much of this medicine contact a poison control center or emergency room at once. NOTE: This medicine is only for you. Do not share this medicine with others. What if I miss a dose? It is important not to miss a dose. Call your doctor or health care professional if you are unable to keep an appointment. What may interact with this medicine? -medicines for seizures -medicines to increase blood counts like filgrastim, pegfilgrastim, sargramostim -some antibiotics like amikacin, gentamicin, neomycin, streptomycin, tobramycin -vaccines Talk to your doctor or health care professional before taking any of these medicines: -acetaminophen -aspirin -ibuprofen -ketoprofen -naproxen This list may not describe all possible interactions. Give your health care provider a list of all the medicines, herbs, non-prescription drugs, or dietary supplements you use. Also tell them if you smoke, drink alcohol, or use illegal drugs. Some items may interact with your  medicine. What should I watch for while using this medicine? Your condition will be monitored carefully while you are receiving this medicine. You will need important blood work done while you are taking this medicine. This drug may make you feel generally unwell. This is not uncommon, as chemotherapy can affect healthy cells as well as cancer cells. Report any side effects. Continue your course of treatment even though you feel ill unless your doctor tells you to stop. In some cases, you may be given additional medicines to help with side effects. Follow all directions for their use. Call your doctor or health care professional for advice if you get a fever, chills or sore throat, or other symptoms of a cold or flu. Do not treat yourself. This drug decreases your body's ability to fight infections. Try to avoid being around people who are sick. This medicine may increase your risk to bruise or bleed. Call your doctor or health care professional if you notice any unusual bleeding. Be careful brushing and flossing your teeth or using a toothpick because you may get an infection or bleed more easily. If you have any dental work done, tell your dentist you are receiving this medicine. Avoid taking products that contain aspirin, acetaminophen, ibuprofen, naproxen, or ketoprofen unless instructed by your doctor. These medicines may hide a fever. Do not become pregnant while taking this medicine. Women should inform their doctor if they wish to become pregnant or think they might be pregnant. There is a potential for serious side effects  to an unborn child. Talk to your health care professional or pharmacist for more information. Do not breast-feed an infant while taking this medicine. What side effects may I notice from receiving this medicine? Side effects that you should report to your doctor or health care professional as soon as possible: -allergic reactions like skin rash, itching or hives, swelling of  the face, lips, or tongue -signs of infection - fever or chills, cough, sore throat, pain or difficulty passing urine -signs of decreased platelets or bleeding - bruising, pinpoint red spots on the skin, black, tarry stools, nosebleeds -signs of decreased red blood cells - unusually weak or tired, fainting spells, lightheadedness -breathing problems -changes in hearing -changes in vision -chest pain -high blood pressure -low blood counts - This drug may decrease the number of white blood cells, red blood cells and platelets. You may be at increased risk for infections and bleeding. -nausea and vomiting -pain, swelling, redness or irritation at the injection site -pain, tingling, numbness in the hands or feet -problems with balance, talking, walking -trouble passing urine or change in the amount of urine Side effects that usually do not require medical attention (report to your doctor or health care professional if they continue or are bothersome): -hair loss -loss of appetite -metallic taste in the mouth or changes in taste This list may not describe all possible side effects. Call your doctor for medical advice about side effects. You may report side effects to FDA at 1-800-FDA-1088. Where should I keep my medicine? This drug is given in a hospital or clinic and will not be stored at home. NOTE: This sheet is a summary. It may not cover all possible information. If you have questions about this medicine, talk to your doctor, pharmacist, or health care provider.    2016, [employer]. (2007-08-12 14:38:05)

## 2015-11-29 ENCOUNTER — Telehealth: Payer: Self-pay | Admitting: Medical Oncology

## 2015-11-29 ENCOUNTER — Ambulatory Visit
Admission: RE | Admit: 2015-11-29 | Discharge: 2015-11-29 | Disposition: A | Discharge status: Home / Self Care | Payer: Medicare PPO | Source: Ambulatory Visit | Attending: Radiation Oncology | Admitting: Radiation Oncology

## 2015-11-29 DIAGNOSIS — Z510 Encounter for antineoplastic radiation therapy: Secondary | ICD-10-CM | POA: Diagnosis not present

## 2015-11-29 NOTE — Telephone Encounter (Signed)
I spoke to staff at [facility] and was told pt did fine last night and today after treatment yesterday. I instructed her to call if pt is having any problems or side effects.

## 2015-11-29 NOTE — Telephone Encounter (Signed)
-----   Message from Arty Baumgartner, RN sent at 11/28/2015 11:42 AM EDT ----- Regarding: first time chemo MM First time taxol/carbo.  Tolerated well.  Mohamed.  Lives in assisted living

## 2015-11-30 ENCOUNTER — Encounter: Payer: Self-pay | Admitting: Radiation Oncology

## 2015-11-30 ENCOUNTER — Ambulatory Visit
Admission: RE | Admit: 2015-11-30 | Discharge: 2015-11-30 | Disposition: A | Discharge status: Home / Self Care | Payer: Medicare PPO | Source: Ambulatory Visit | Attending: Radiation Oncology | Admitting: Radiation Oncology

## 2015-11-30 VITALS — BP 121/81 | HR 62 | Temp 97.8°F | Resp 18 | Ht 68.0 in | Wt 178.1 lb

## 2015-11-30 DIAGNOSIS — Z510 Encounter for antineoplastic radiation therapy: Secondary | ICD-10-CM | POA: Diagnosis not present

## 2015-11-30 DIAGNOSIS — C3492 Malignant neoplasm of unspecified part of left bronchus or lung: Secondary | ICD-10-CM

## 2015-11-30 MED ORDER — RADIAPLEXRX EX GEL
Freq: Once | CUTANEOUS | Status: AC
  Administered 2015-11-30: 09:00:00 via TOPICAL

## 2015-11-30 NOTE — Progress Notes (Addendum)
Mr. Christian Espinoza has received 3 fractions to left lung.  Skin to left chest with normal color.  Having SOB with sitting and exertion and wheezing at times..  Using Duoneb and Albuterol inhaler.  Denies coughing and problems swallowing.  Appetite is good. Education done today Radiaplex gel given. Wt Readings from Last 3 Encounters:  11/30/15 178 lb 1.6 oz (80.786 kg)  11/18/15 177 lb 1.6 oz (80.332 kg)  11/17/15 176 lb 4.8 oz (79.969 kg)  BP 121/81 mmHg  Pulse 62  Temp(Src) 97.8 F (36.6 C) (Oral)  Resp 18  Ht '5\' 8"'$  (1.727 m)  Wt 178 lb 1.6 oz (80.786 kg)  BMI 27.09 kg/m2  SpO2 97%

## 2015-11-30 NOTE — Progress Notes (Signed)
  Radiation Oncology         [phone]   Name: Christian Espinoza MRN: [id number]   Date: 11/30/2015  DOB: 02-May-1946   Weekly Radiation Therapy Management    ICD-9-CM ICD-10-CM   1. 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  The patient has received 3 fractions to left lung. The patient has SOB with sitting and exertion and wheezing at times.. Using Duoneb and Albuterol inhalers. Denies coughing or problems swallowing. Good appetite.  Set-up films were reviewed. The chart was checked.  Physical Findings  height is '5\' 8"'$  (1.727 m) and weight is 178 lb 1.6 oz (80.786 kg). His oral temperature is 97.8 F (36.6 C). His blood pressure is 121/81 and his pulse is 62. His respiration is 18 and oxygen saturation is 97%. . Weight essentially stable.  No significant changes.  Impression The patient is tolerating radiation.  Plan Continue treatment as planned. A tube of Radiaplex was provided.     Sheral Apley Tammi Klippel, M.D.  This document serves as a record of services personally performed by [name], MD. It was created on his behalf by Darcus Austin, a trained medical scribe. The creation of this record is based on the scribe's personal observations and the provider's statements to them. This document has been checked and approved by the attending provider.

## 2015-12-01 ENCOUNTER — Ambulatory Visit
Admission: RE | Admit: 2015-12-01 | Discharge: 2015-12-01 | Disposition: A | Discharge status: Home / Self Care | Payer: Medicare PPO | Source: Ambulatory Visit | Attending: Radiation Oncology | Admitting: Radiation Oncology

## 2015-12-01 DIAGNOSIS — Z510 Encounter for antineoplastic radiation therapy: Secondary | ICD-10-CM | POA: Diagnosis not present

## 2015-12-02 ENCOUNTER — Ambulatory Visit
Admission: RE | Admit: 2015-12-02 | Discharge: 2015-12-02 | Disposition: A | Discharge status: Home / Self Care | Payer: Medicare PPO | Source: Ambulatory Visit | Attending: Radiation Oncology | Admitting: Radiation Oncology

## 2015-12-02 DIAGNOSIS — Z510 Encounter for antineoplastic radiation therapy: Secondary | ICD-10-CM | POA: Diagnosis not present

## 2015-12-05 ENCOUNTER — Other Ambulatory Visit (HOSPITAL_BASED_OUTPATIENT_CLINIC_OR_DEPARTMENT_OTHER): Payer: Medicare PPO

## 2015-12-05 ENCOUNTER — Ambulatory Visit (HOSPITAL_BASED_OUTPATIENT_CLINIC_OR_DEPARTMENT_OTHER): Payer: Medicare PPO | Admitting: Nurse Practitioner

## 2015-12-05 ENCOUNTER — Ambulatory Visit
Admission: RE | Admit: 2015-12-05 | Discharge: 2015-12-05 | Disposition: A | Discharge status: Home / Self Care | Payer: Medicare PPO | Source: Ambulatory Visit | Attending: Radiation Oncology | Admitting: Radiation Oncology

## 2015-12-05 ENCOUNTER — Encounter: Payer: Self-pay | Admitting: Nurse Practitioner

## 2015-12-05 ENCOUNTER — Ambulatory Visit (HOSPITAL_BASED_OUTPATIENT_CLINIC_OR_DEPARTMENT_OTHER): Payer: Medicare PPO

## 2015-12-05 VITALS — BP 110/71 | HR 78 | Temp 98.0°F | Resp 18 | Ht 68.0 in | Wt 178.0 lb

## 2015-12-05 DIAGNOSIS — Z5111 Encounter for antineoplastic chemotherapy: Secondary | ICD-10-CM | POA: Diagnosis not present

## 2015-12-05 DIAGNOSIS — C3432 Malignant neoplasm of lower lobe, left bronchus or lung: Secondary | ICD-10-CM

## 2015-12-05 DIAGNOSIS — C3492 Malignant neoplasm of unspecified part of left bronchus or lung: Secondary | ICD-10-CM

## 2015-12-05 DIAGNOSIS — Z510 Encounter for antineoplastic radiation therapy: Secondary | ICD-10-CM | POA: Diagnosis not present

## 2015-12-05 LAB — COMPREHENSIVE METABOLIC PANEL
ALBUMIN: 3.3 g/dL — AB (ref 3.5–5.0)
ALK PHOS: 84 U/L (ref 40–150)
ALT: 13 U/L (ref 0–55)
AST: 9 U/L (ref 5–34)
Anion Gap: 10 mEq/L (ref 3–11)
BILIRUBIN TOTAL: 0.38 mg/dL (ref 0.20–1.20)
BUN: 10.3 mg/dL (ref 7.0–26.0)
CALCIUM: 9 mg/dL (ref 8.4–10.4)
CO2: 25 mEq/L (ref 22–29)
Chloride: 102 mEq/L (ref 98–109)
Creatinine: 1 mg/dL (ref 0.7–1.3)
EGFR: 77 mL/min/{1.73_m2} — ABNORMAL LOW (ref >90)
GLUCOSE: 86 mg/dL (ref 70–140)
POTASSIUM: 4.1 meq/L (ref 3.5–5.1)
SODIUM: 137 meq/L (ref 136–145)
Total Protein: 6.4 g/dL (ref 6.4–8.3)

## 2015-12-05 LAB — CBC WITH DIFFERENTIAL/PLATELET
BASO%: 0.6 % (ref 0.0–2.0)
BASOS ABS: 0 10*3/uL (ref 0.0–0.1)
EOS%: 2.6 % (ref 0.0–7.0)
Eosinophils Absolute: 0.2 10*3/uL (ref 0.0–0.5)
HEMATOCRIT: 42.9 % (ref 38.4–49.9)
HEMOGLOBIN: 14.3 g/dL (ref 13.0–17.1)
LYMPH#: 1 10*3/uL (ref 0.9–3.3)
LYMPH%: 11.9 % — ABNORMAL LOW (ref 14.0–49.0)
MCH: 28.4 pg (ref 27.2–33.4)
MCHC: 33.4 g/dL (ref 32.0–36.0)
MCV: 85.2 fL (ref 79.3–98.0)
MONO#: 0.4 10*3/uL (ref 0.1–0.9)
MONO%: 5.2 % (ref 0.0–14.0)
NEUT%: 79.7 % — ABNORMAL HIGH (ref 39.0–75.0)
NEUTROS ABS: 6.6 10*3/uL — AB (ref 1.5–6.5)
Platelets: 192 10*3/uL (ref 140–400)
RBC: 5.03 10*6/uL (ref 4.20–5.82)
RDW: 13.2 % (ref 11.0–14.6)
WBC: 8.2 10*3/uL (ref 4.0–10.3)

## 2015-12-05 MED ORDER — DIPHENHYDRAMINE HCL 50 MG/ML IJ SOLN
50.0000 mg | Freq: Once | INTRAMUSCULAR | Status: AC
  Administered 2015-12-05: 50 mg via INTRAVENOUS

## 2015-12-05 MED ORDER — FAMOTIDINE IN NACL 20-0.9 MG/50ML-% IV SOLN
INTRAVENOUS | Status: AC
  Filled 2015-12-05: qty 50

## 2015-12-05 MED ORDER — FAMOTIDINE IN NACL 20-0.9 MG/50ML-% IV SOLN
20.0000 mg | Freq: Once | INTRAVENOUS | Status: AC
  Administered 2015-12-05: 20 mg via INTRAVENOUS

## 2015-12-05 MED ORDER — DIPHENHYDRAMINE HCL 50 MG/ML IJ SOLN
INTRAMUSCULAR | Status: AC
  Filled 2015-12-05: qty 1

## 2015-12-05 MED ORDER — SODIUM CHLORIDE 0.9 % IV SOLN
184.8000 mg | Freq: Once | INTRAVENOUS | Status: AC
  Administered 2015-12-05: 180 mg via INTRAVENOUS
  Filled 2015-12-05: qty 18

## 2015-12-05 MED ORDER — SODIUM CHLORIDE 0.9 % IV SOLN
45.0000 mg/m2 | Freq: Once | INTRAVENOUS | Status: AC
  Administered 2015-12-05: 90 mg via INTRAVENOUS
  Filled 2015-12-05: qty 15

## 2015-12-05 MED ORDER — SODIUM CHLORIDE 0.9 % IV SOLN
20.0000 mg | Freq: Once | INTRAVENOUS | Status: AC
  Administered 2015-12-05: 20 mg via INTRAVENOUS
  Filled 2015-12-05: qty 2

## 2015-12-05 MED ORDER — PALONOSETRON HCL INJECTION 0.25 MG/5ML
0.2500 mg | Freq: Once | INTRAVENOUS | Status: AC
  Administered 2015-12-05: 0.25 mg via INTRAVENOUS

## 2015-12-05 MED ORDER — PALONOSETRON HCL INJECTION 0.25 MG/5ML
INTRAVENOUS | Status: AC
  Filled 2015-12-05: qty 5

## 2015-12-05 MED ORDER — SODIUM CHLORIDE 0.9 % IV SOLN
Freq: Once | INTRAVENOUS | Status: AC
  Administered 2015-12-05: 11:00:00 via INTRAVENOUS

## 2015-12-05 NOTE — Patient Instructions (Signed)
[facility] Discharge Instructions for Patients Receiving Chemotherapy  Today you received the following chemotherapy agents Taxol and Carboplatin. To help prevent nausea and vomiting after your treatment, we encourage you to take your nausea medication as directed.  If you develop nausea and vomiting that is not controlled by your nausea medication, call the clinic.   BELOW ARE SYMPTOMS THAT SHOULD BE REPORTED IMMEDIATELY:  *FEVER GREATER THAN 100.5 F  *CHILLS WITH OR WITHOUT FEVER  NAUSEA AND VOMITING THAT IS NOT CONTROLLED WITH YOUR NAUSEA MEDICATION  *UNUSUAL SHORTNESS OF BREATH  *UNUSUAL BRUISING OR BLEEDING  TENDERNESS IN MOUTH AND THROAT WITH OR WITHOUT PRESENCE OF ULCERS  *URINARY PROBLEMS  *BOWEL PROBLEMS  UNUSUAL RASH Items with * indicate a potential emergency and should be followed up as soon as possible.  Feel free to call the clinic you have any questions or concerns. The clinic phone number is [phone].  Please show the CHEMO ALERT CARD at check-in to the Emergency Department and triage nurse.    

## 2015-12-05 NOTE — Progress Notes (Signed)
  [facility] OFFICE PROGRESS NOTE   DIAGNOSIS: Stage IIIB (T1b, N3, M0) non-small cell lung cancer, squamous cell carcinoma diagnosed in July 2017 presented with left lower lobe lung mass in addition to suspicious mediastinal lymphadenopathy.  PRIOR THERAPY: None  CURRENT THERAPY: Concurrent chemoradiation with weekly carboplatin for AUC of 2 and paclitaxel 45 MG/M2. First dose 11/28/2015.  INTERVAL HISTORY:   Mr. Dollard returns as scheduled. He completed cycle 1 weekly carboplatin/Taxol 11/28/2015. He continues radiation. He denies nausea/vomiting. No mouth sores. No diarrhea. He has intermittent constipation. He takes a laxative as needed. He reports stable dyspnea. He has intermittent wheezing. He uses an inhaler as needed. No cough. No fever. He denies pain. He has a good appetite.  Objective:  Vital signs in last 24 hours:  Blood pressure 110/71, pulse 78, temperature 98 F (36.7 C), temperature source Oral, resp. rate 18, height '5\' 8"'$  (1.727 m), weight 178 lb (80.74 kg), SpO2 92 %.    HEENT: No thrush or ulcers. Resp: Bilateral expiratory wheezes. No respiratory distress. Cardio: Regular rate and rhythm. GI: Abdomen soft and nontender. No hepatomegaly. Vascular: No leg edema.   Lab Results:  Lab Results  Component Value Date   WBC 8.2 12/05/2015   HGB 14.3 12/05/2015   HCT 42.9 12/05/2015   MCV 85.2 12/05/2015   PLT 192 12/05/2015   NEUTROABS 6.6* 12/05/2015    Imaging:  No results found.  Medications: I have reviewed the patient's current medications.  Assessment/Plan: 1. Stage IIIB non-small cell lung cancer, squamous cell carcinoma presenting with left lower lobe lung mass in addition to suspicious mediastinal lymphadenopathy. Initiation of radiation and concurrent weekly carboplatin/Taxol 11/28/2015.   Disposition: Mr. Dilauro appears stable. He continues radiation. Plan to proceed with cycle 2 weekly carboplatin/Taxol today as scheduled. He will  return for a follow-up visit in 2 weeks. He will contact the office in the interim with any problems.  Plan reviewed with Dr. Julien Nordmann.    Ned Card ANP/GNP-BC   12/05/2015  10:32 AM

## 2015-12-06 ENCOUNTER — Ambulatory Visit
Admission: RE | Admit: 2015-12-06 | Discharge: 2015-12-06 | Disposition: A | Discharge status: Home / Self Care | Payer: Medicare PPO | Source: Ambulatory Visit | Attending: Radiation Oncology | Admitting: Radiation Oncology

## 2015-12-06 DIAGNOSIS — Z510 Encounter for antineoplastic radiation therapy: Secondary | ICD-10-CM | POA: Diagnosis not present

## 2015-12-07 ENCOUNTER — Ambulatory Visit
Admission: RE | Admit: 2015-12-07 | Discharge: 2015-12-07 | Disposition: A | Discharge status: Home / Self Care | Payer: Medicare PPO | Source: Ambulatory Visit | Attending: Radiation Oncology | Admitting: Radiation Oncology

## 2015-12-07 DIAGNOSIS — Z510 Encounter for antineoplastic radiation therapy: Secondary | ICD-10-CM | POA: Diagnosis not present

## 2015-12-08 ENCOUNTER — Ambulatory Visit
Admission: RE | Admit: 2015-12-08 | Discharge: 2015-12-08 | Disposition: A | Discharge status: Home / Self Care | Payer: Medicare PPO | Source: Ambulatory Visit | Attending: Radiation Oncology | Admitting: Radiation Oncology

## 2015-12-08 DIAGNOSIS — Z510 Encounter for antineoplastic radiation therapy: Secondary | ICD-10-CM | POA: Diagnosis not present

## 2015-12-09 ENCOUNTER — Ambulatory Visit
Admission: RE | Admit: 2015-12-09 | Discharge: 2015-12-09 | Disposition: A | Discharge status: Home / Self Care | Payer: Medicare PPO | Source: Ambulatory Visit | Attending: Radiation Oncology | Admitting: Radiation Oncology

## 2015-12-09 ENCOUNTER — Encounter: Payer: Self-pay | Admitting: Radiation Oncology

## 2015-12-09 VITALS — BP 114/71 | HR 93 | Resp 18 | Wt 176.0 lb

## 2015-12-09 DIAGNOSIS — C3492 Malignant neoplasm of unspecified part of left bronchus or lung: Secondary | ICD-10-CM

## 2015-12-09 DIAGNOSIS — Z510 Encounter for antineoplastic radiation therapy: Secondary | ICD-10-CM | POA: Diagnosis not present

## 2015-12-09 LAB — FUNGAL ORGANISM REFLEX

## 2015-12-09 LAB — FUNGUS CULTURE WITH STAIN

## 2015-12-09 LAB — FUNGUS CULTURE RESULT

## 2015-12-09 NOTE — Progress Notes (Signed)
  Radiation Oncology         [phone]   Name: Christian Espinoza MRN: [id number]   Date: 12/09/2015  DOB: April 12, 1946   Weekly Radiation Therapy Management    ICD-9-CM ICD-10-CM   1. 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  Weight and vitals stable. Denies pain. Denies skin changes within treatment field. Reports using radiaplex bid as directed. Reports an occasional dry cough. Reports SOB is no worse. Reports difficulty swallowing meat, bread and water at time but, no pain associated with swallowing. Reports moderate fatigue.    Set-up films were reviewed. The chart was checked.  Physical Findings  weight is 176 lb (79.833 kg). His blood pressure is 114/71 and his pulse is 93. His respiration is 18 and oxygen saturation is 95%. . Weight essentially stable.  No significant changes.  Impression The patient is tolerating radiation.  Plan Continue treatment as planned. A tube of Radiaplex was provided.     Sheral Apley Tammi Klippel, M.D.  This document serves as a record of services personally performed by [name], MD. It was created on his behalf by Truddie Hidden, a trained medical scribe. The creation of this record is based on the scribe's personal observations and the provider's statements to them. This document has been checked and approved by the attending provider.

## 2015-12-09 NOTE — Progress Notes (Signed)
Weight and vitals stable. Denies pain. Denies skin changes within treatment field. Reports using radiaplex bid as directed. Reports an occasional dry cough. Reports SOB is no worse. Reports difficulty swallowing meat, bread and water at time but, no pain associated with swallowing. Reports moderate fatigue.   BP 114/71 mmHg  Pulse 93  Resp 18  Wt 176 lb (79.833 kg)  SpO2 95% Wt Readings from Last 3 Encounters:  12/09/15 176 lb (79.833 kg)  12/05/15 178 lb (80.74 kg)  11/30/15 178 lb 1.6 oz (80.786 kg)

## 2015-12-12 ENCOUNTER — Other Ambulatory Visit (HOSPITAL_BASED_OUTPATIENT_CLINIC_OR_DEPARTMENT_OTHER): Payer: Medicare PPO

## 2015-12-12 ENCOUNTER — Ambulatory Visit (HOSPITAL_BASED_OUTPATIENT_CLINIC_OR_DEPARTMENT_OTHER): Payer: Medicare PPO

## 2015-12-12 ENCOUNTER — Ambulatory Visit
Admission: RE | Admit: 2015-12-12 | Discharge: 2015-12-12 | Disposition: A | Discharge status: Home / Self Care | Payer: Medicare PPO | Source: Ambulatory Visit | Attending: Radiation Oncology | Admitting: Radiation Oncology

## 2015-12-12 VITALS — BP 115/83 | HR 85 | Temp 98.0°F | Resp 20

## 2015-12-12 DIAGNOSIS — C3432 Malignant neoplasm of lower lobe, left bronchus or lung: Secondary | ICD-10-CM

## 2015-12-12 DIAGNOSIS — Z5111 Encounter for antineoplastic chemotherapy: Secondary | ICD-10-CM | POA: Diagnosis not present

## 2015-12-12 DIAGNOSIS — C3492 Malignant neoplasm of unspecified part of left bronchus or lung: Secondary | ICD-10-CM

## 2015-12-12 DIAGNOSIS — Z510 Encounter for antineoplastic radiation therapy: Secondary | ICD-10-CM | POA: Diagnosis not present

## 2015-12-12 LAB — CBC WITH DIFFERENTIAL/PLATELET
BASO%: 0.4 % (ref 0.0–2.0)
Basophils Absolute: 0 10*3/uL (ref 0.0–0.1)
EOS%: 1.2 % (ref 0.0–7.0)
Eosinophils Absolute: 0.1 10*3/uL (ref 0.0–0.5)
HCT: 42.2 % (ref 38.4–49.9)
HGB: 14.3 g/dL (ref 13.0–17.1)
LYMPH%: 10.9 % — AB (ref 14.0–49.0)
MCH: 28.8 pg (ref 27.2–33.4)
MCHC: 33.9 g/dL (ref 32.0–36.0)
MCV: 84.9 fL (ref 79.3–98.0)
MONO#: 0.3 10*3/uL (ref 0.1–0.9)
MONO%: 5.8 % (ref 0.0–14.0)
NEUT#: 4.1 10*3/uL (ref 1.5–6.5)
NEUT%: 81.7 % — AB (ref 39.0–75.0)
PLATELETS: 165 10*3/uL (ref 140–400)
RBC: 4.97 10*6/uL (ref 4.20–5.82)
RDW: 12.9 % (ref 11.0–14.6)
WBC: 5 10*3/uL (ref 4.0–10.3)
lymph#: 0.5 10*3/uL — ABNORMAL LOW (ref 0.9–3.3)

## 2015-12-12 LAB — COMPREHENSIVE METABOLIC PANEL
ALT: 12 U/L (ref 0–55)
ANION GAP: 9 meq/L (ref 3–11)
AST: 11 U/L (ref 5–34)
Albumin: 3.4 g/dL — ABNORMAL LOW (ref 3.5–5.0)
Alkaline Phosphatase: 86 U/L (ref 40–150)
BUN: 8.9 mg/dL (ref 7.0–26.0)
CHLORIDE: 102 meq/L (ref 98–109)
CO2: 28 meq/L (ref 22–29)
CREATININE: 1.1 mg/dL (ref 0.7–1.3)
Calcium: 9.1 mg/dL (ref 8.4–10.4)
EGFR: 72 mL/min/{1.73_m2} — ABNORMAL LOW (ref >90)
Glucose: 96 mg/dl (ref 70–140)
Potassium: 4.1 mEq/L (ref 3.5–5.1)
SODIUM: 139 meq/L (ref 136–145)
Total Bilirubin: 0.39 mg/dL (ref 0.20–1.20)
Total Protein: 6.4 g/dL (ref 6.4–8.3)

## 2015-12-12 MED ORDER — PALONOSETRON HCL INJECTION 0.25 MG/5ML
0.2500 mg | Freq: Once | INTRAVENOUS | Status: AC
  Administered 2015-12-12: 0.25 mg via INTRAVENOUS

## 2015-12-12 MED ORDER — SODIUM CHLORIDE 0.9 % IV SOLN
Freq: Once | INTRAVENOUS | Status: AC
  Administered 2015-12-12: 12:00:00 via INTRAVENOUS

## 2015-12-12 MED ORDER — SODIUM CHLORIDE 0.9 % IV SOLN
45.0000 mg/m2 | Freq: Once | INTRAVENOUS | Status: AC
  Administered 2015-12-12: 90 mg via INTRAVENOUS
  Filled 2015-12-12: qty 15

## 2015-12-12 MED ORDER — HEPARIN SOD (PORK) LOCK FLUSH 100 UNIT/ML IV SOLN
500.0000 [IU] | Freq: Once | INTRAVENOUS | Status: DC | PRN
  Filled 2015-12-12: qty 5

## 2015-12-12 MED ORDER — FAMOTIDINE IN NACL 20-0.9 MG/50ML-% IV SOLN
20.0000 mg | Freq: Once | INTRAVENOUS | Status: AC
  Administered 2015-12-12: 20 mg via INTRAVENOUS

## 2015-12-12 MED ORDER — SODIUM CHLORIDE 0.9% FLUSH
10.0000 mL | INTRAVENOUS | Status: DC | PRN
  Filled 2015-12-12: qty 10

## 2015-12-12 MED ORDER — DIPHENHYDRAMINE HCL 50 MG/ML IJ SOLN
50.0000 mg | Freq: Once | INTRAMUSCULAR | Status: AC
  Administered 2015-12-12: 50 mg via INTRAVENOUS

## 2015-12-12 MED ORDER — SODIUM CHLORIDE 0.9 % IV SOLN
20.0000 mg | Freq: Once | INTRAVENOUS | Status: AC
  Administered 2015-12-12: 20 mg via INTRAVENOUS
  Filled 2015-12-12: qty 2

## 2015-12-12 MED ORDER — SODIUM CHLORIDE 0.9 % IV SOLN
184.8000 mg | Freq: Once | INTRAVENOUS | Status: AC
  Administered 2015-12-12: 180 mg via INTRAVENOUS
  Filled 2015-12-12: qty 18

## 2015-12-12 NOTE — Patient Instructions (Signed)
[facility] Discharge Instructions for Patients Receiving Chemotherapy  Today you received the following chemotherapy agents Taxol and Carboplatin  To help prevent nausea and vomiting after your treatment, we encourage you to take your nausea medication     If you develop nausea and vomiting that is not controlled by your nausea medication, call the clinic.   BELOW ARE SYMPTOMS THAT SHOULD BE REPORTED IMMEDIATELY:  *FEVER GREATER THAN 100.5 F  *CHILLS WITH OR WITHOUT FEVER  NAUSEA AND VOMITING THAT IS NOT CONTROLLED WITH YOUR NAUSEA MEDICATION  *UNUSUAL SHORTNESS OF BREATH  *UNUSUAL BRUISING OR BLEEDING  TENDERNESS IN MOUTH AND THROAT WITH OR WITHOUT PRESENCE OF ULCERS  *URINARY PROBLEMS  *BOWEL PROBLEMS  UNUSUAL RASH Items with * indicate a potential emergency and should be followed up as soon as possible.  Feel free to call the clinic you have any questions or concerns. The clinic phone number is [phone].  Please show the CHEMO ALERT CARD at check-in to the Emergency Department and triage nurse.   

## 2015-12-13 ENCOUNTER — Ambulatory Visit
Admission: RE | Admit: 2015-12-13 | Discharge: 2015-12-13 | Disposition: A | Discharge status: Home / Self Care | Payer: Medicare PPO | Source: Ambulatory Visit | Attending: Radiation Oncology | Admitting: Radiation Oncology

## 2015-12-13 DIAGNOSIS — Z510 Encounter for antineoplastic radiation therapy: Secondary | ICD-10-CM | POA: Diagnosis not present

## 2015-12-14 ENCOUNTER — Ambulatory Visit
Admission: RE | Admit: 2015-12-14 | Discharge: 2015-12-14 | Disposition: A | Discharge status: Home / Self Care | Payer: Medicare PPO | Source: Ambulatory Visit | Attending: Radiation Oncology | Admitting: Radiation Oncology

## 2015-12-14 ENCOUNTER — Telehealth: Payer: Self-pay | Admitting: Internal Medicine

## 2015-12-14 DIAGNOSIS — Z510 Encounter for antineoplastic radiation therapy: Secondary | ICD-10-CM | POA: Diagnosis not present

## 2015-12-14 NOTE — Telephone Encounter (Signed)
Appointment confirmed with [facility] Merleen Nicely.

## 2015-12-15 ENCOUNTER — Ambulatory Visit
Admission: RE | Admit: 2015-12-15 | Discharge: 2015-12-15 | Disposition: A | Discharge status: Home / Self Care | Payer: Medicare PPO | Source: Ambulatory Visit | Attending: Radiation Oncology | Admitting: Radiation Oncology

## 2015-12-15 DIAGNOSIS — Z510 Encounter for antineoplastic radiation therapy: Secondary | ICD-10-CM | POA: Diagnosis not present

## 2015-12-16 ENCOUNTER — Other Ambulatory Visit: Payer: Self-pay | Admitting: Radiation Oncology

## 2015-12-16 ENCOUNTER — Ambulatory Visit
Admission: RE | Admit: 2015-12-16 | Discharge: 2015-12-16 | Disposition: A | Discharge status: Home / Self Care | Payer: Medicare PPO | Source: Ambulatory Visit | Attending: Radiation Oncology | Admitting: Radiation Oncology

## 2015-12-16 ENCOUNTER — Telehealth: Payer: Self-pay | Admitting: Radiation Oncology

## 2015-12-16 VITALS — BP 106/66 | HR 73 | Temp 97.8°F | Resp 16 | Wt 174.1 lb

## 2015-12-16 DIAGNOSIS — Z510 Encounter for antineoplastic radiation therapy: Secondary | ICD-10-CM | POA: Diagnosis not present

## 2015-12-16 DIAGNOSIS — C3492 Malignant neoplasm of unspecified part of left bronchus or lung: Secondary | ICD-10-CM

## 2015-12-16 MED ORDER — SUCRALFATE 1 G PO TABS: 1.0000 g | ORAL_TABLET | Freq: Three times a day (TID) | ORAL | 2 refills | Status: AC

## 2015-12-16 NOTE — Progress Notes (Signed)
Weight and vitals stable. Reports new onset sternal pain. Reports he has not medication to relieve this pain. Denies difficulty or pain associated with swallowing. No skin changes within treatment field. Reports using radiaplex bid as directed. Denies cough. Reports SOB is unchanged.  BP 106/66   Pulse 73   Temp 97.8 F (36.6 C) (Oral)   Resp 16   Wt 174 lb 1.6 oz (79 kg)   SpO2 100%   BMI 26.47 kg/m  Wt Readings from Last 3 Encounters:  12/16/15 174 lb 1.6 oz (79 kg)  12/09/15 176 lb (79.8 kg)  12/05/15 178 lb (80.7 kg)

## 2015-12-16 NOTE — Telephone Encounter (Signed)
Spoke with Richardson Landry @ [facility] a second time. Explained again that script for Carafate has been called to [place] at [facility]. She verbalized understanding and expressed appreciation for the return call.

## 2015-12-16 NOTE — Progress Notes (Signed)
  Radiation Oncology         [phone]   Name: Christian Espinoza MRN: [id number]   Date: 12/16/2015  DOB: Jul 02, 1945   Weekly Radiation Therapy Management    ICD-9-CM ICD-10-CM   1. 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  Weight and vitals stable. Reports new onset sternal pain. Reports he has not medication to relieve this pain. Denies difficulty or pain associated with swallowing. No skin changes within treatment field. Reports using radiaplex bid as directed. Denies cough. Reports SOB is unchanged.    Set-up films were reviewed. The chart was checked.  Physical Findings  weight is 174 lb 1.6 oz (79 kg). His oral temperature is 97.8 F (36.6 C). His blood pressure is 106/66 and his pulse is 73. His respiration is 16 and oxygen saturation is 100%. . Weight essentially stable.  No significant changes.  Impression The patient is tolerating radiation.   Plan Continue treatment as planned.  I will prescribe the patient Carafate to treat his throat pain.      Sheral Apley Tammi Klippel, M.D.  This document serves as a record of services personally performed by [name], MD. It was created on his behalf by Truddie Hidden, a trained medical scribe. The creation of this record is based on the scribe's personal observations and the provider's statements to them. This document has been checked and approved by the attending provider.

## 2015-12-16 NOTE — Telephone Encounter (Signed)
Per Dr. Johny Shears order phoned [facility] to make them aware Carafate has been prescribed. Confirmed this script should be called to [facility] at [phone]. Called in carafate 1 g per Dr. Johny Shears order to Meredith,pharmacist , at [place].

## 2015-12-19 ENCOUNTER — Other Ambulatory Visit (HOSPITAL_BASED_OUTPATIENT_CLINIC_OR_DEPARTMENT_OTHER): Payer: Medicare PPO

## 2015-12-19 ENCOUNTER — Ambulatory Visit (HOSPITAL_BASED_OUTPATIENT_CLINIC_OR_DEPARTMENT_OTHER): Payer: Medicare PPO | Admitting: Nurse Practitioner

## 2015-12-19 ENCOUNTER — Ambulatory Visit
Admission: RE | Admit: 2015-12-19 | Discharge: 2015-12-19 | Disposition: A | Discharge status: Home / Self Care | Payer: Medicare PPO | Source: Ambulatory Visit | Attending: Radiation Oncology | Admitting: Radiation Oncology

## 2015-12-19 ENCOUNTER — Ambulatory Visit (HOSPITAL_BASED_OUTPATIENT_CLINIC_OR_DEPARTMENT_OTHER): Payer: Medicare PPO

## 2015-12-19 VITALS — BP 111/62 | HR 90 | Temp 98.1°F | Resp 19 | Ht 68.0 in | Wt 177.0 lb

## 2015-12-19 DIAGNOSIS — C3432 Malignant neoplasm of lower lobe, left bronchus or lung: Secondary | ICD-10-CM

## 2015-12-19 DIAGNOSIS — Z510 Encounter for antineoplastic radiation therapy: Secondary | ICD-10-CM | POA: Diagnosis not present

## 2015-12-19 DIAGNOSIS — C3492 Malignant neoplasm of unspecified part of left bronchus or lung: Secondary | ICD-10-CM

## 2015-12-19 DIAGNOSIS — Z5111 Encounter for antineoplastic chemotherapy: Secondary | ICD-10-CM | POA: Diagnosis not present

## 2015-12-19 LAB — CBC WITH DIFFERENTIAL/PLATELET
BASO%: 0.7 % (ref 0.0–2.0)
BASOS ABS: 0 10*3/uL (ref 0.0–0.1)
EOS ABS: 0 10*3/uL (ref 0.0–0.5)
EOS%: 1 % (ref 0.0–7.0)
HCT: 41.2 % (ref 38.4–49.9)
HGB: 13.7 g/dL (ref 13.0–17.1)
LYMPH%: 10.4 % — AB (ref 14.0–49.0)
MCH: 28.3 pg (ref 27.2–33.4)
MCHC: 33.2 g/dL (ref 32.0–36.0)
MCV: 85.2 fL (ref 79.3–98.0)
MONO#: 0.3 10*3/uL (ref 0.1–0.9)
MONO%: 6.3 % (ref 0.0–14.0)
NEUT#: 3.5 10*3/uL (ref 1.5–6.5)
NEUT%: 81.6 % — AB (ref 39.0–75.0)
PLATELETS: 158 10*3/uL (ref 140–400)
RBC: 4.83 10*6/uL (ref 4.20–5.82)
RDW: 13.3 % (ref 11.0–14.6)
WBC: 4.3 10*3/uL (ref 4.0–10.3)
lymph#: 0.4 10*3/uL — ABNORMAL LOW (ref 0.9–3.3)

## 2015-12-19 LAB — COMPREHENSIVE METABOLIC PANEL
ALT: 16 U/L (ref 0–55)
ANION GAP: 8 meq/L (ref 3–11)
AST: 10 U/L (ref 5–34)
Albumin: 3.4 g/dL — ABNORMAL LOW (ref 3.5–5.0)
Alkaline Phosphatase: 80 U/L (ref 40–150)
BILIRUBIN TOTAL: 0.46 mg/dL (ref 0.20–1.20)
BUN: 11.8 mg/dL (ref 7.0–26.0)
CHLORIDE: 101 meq/L (ref 98–109)
CO2: 26 meq/L (ref 22–29)
Calcium: 8.8 mg/dL (ref 8.4–10.4)
Creatinine: 1 mg/dL (ref 0.7–1.3)
EGFR: 79 mL/min/{1.73_m2} — AB (ref >90)
GLUCOSE: 99 mg/dL (ref 70–140)
POTASSIUM: 4.3 meq/L (ref 3.5–5.1)
SODIUM: 135 meq/L — AB (ref 136–145)
TOTAL PROTEIN: 6 g/dL — AB (ref 6.4–8.3)

## 2015-12-19 MED ORDER — SODIUM CHLORIDE 0.9 % IV SOLN
20.0000 mg | Freq: Once | INTRAVENOUS | Status: AC
  Administered 2015-12-19: 20 mg via INTRAVENOUS
  Filled 2015-12-19: qty 2

## 2015-12-19 MED ORDER — PALONOSETRON HCL INJECTION 0.25 MG/5ML
INTRAVENOUS | Status: AC
  Filled 2015-12-19: qty 5

## 2015-12-19 MED ORDER — FAMOTIDINE IN NACL 20-0.9 MG/50ML-% IV SOLN
INTRAVENOUS | Status: AC
  Filled 2015-12-19: qty 50

## 2015-12-19 MED ORDER — PACLITAXEL CHEMO INJECTION 300 MG/50ML
45.0000 mg/m2 | Freq: Once | INTRAVENOUS | Status: AC
  Administered 2015-12-19: 90 mg via INTRAVENOUS
  Filled 2015-12-19: qty 15

## 2015-12-19 MED ORDER — DIPHENHYDRAMINE HCL 50 MG/ML IJ SOLN
50.0000 mg | Freq: Once | INTRAMUSCULAR | Status: AC
  Administered 2015-12-19: 50 mg via INTRAVENOUS

## 2015-12-19 MED ORDER — SODIUM CHLORIDE 0.9 % IV SOLN
184.8000 mg | Freq: Once | INTRAVENOUS | Status: AC
  Administered 2015-12-19: 180 mg via INTRAVENOUS
  Filled 2015-12-19: qty 18

## 2015-12-19 MED ORDER — DIPHENHYDRAMINE HCL 50 MG/ML IJ SOLN
INTRAMUSCULAR | Status: AC
  Filled 2015-12-19: qty 1

## 2015-12-19 MED ORDER — PALONOSETRON HCL INJECTION 0.25 MG/5ML
0.2500 mg | Freq: Once | INTRAVENOUS | Status: AC
  Administered 2015-12-19: 0.25 mg via INTRAVENOUS

## 2015-12-19 MED ORDER — SODIUM CHLORIDE 0.9 % IV SOLN
Freq: Once | INTRAVENOUS | Status: AC
  Administered 2015-12-19: 13:00:00 via INTRAVENOUS

## 2015-12-19 MED ORDER — FAMOTIDINE IN NACL 20-0.9 MG/50ML-% IV SOLN
20.0000 mg | Freq: Once | INTRAVENOUS | Status: AC
  Administered 2015-12-19: 20 mg via INTRAVENOUS

## 2015-12-19 NOTE — Patient Instructions (Signed)
[facility] Discharge Instructions for Patients Receiving Chemotherapy  Today you received the following chemotherapy agents Taxol and Carboplatin  To help prevent nausea and vomiting after your treatment, we encourage you to take your nausea medication     If you develop nausea and vomiting that is not controlled by your nausea medication, call the clinic.   BELOW ARE SYMPTOMS THAT SHOULD BE REPORTED IMMEDIATELY:  *FEVER GREATER THAN 100.5 F  *CHILLS WITH OR WITHOUT FEVER  NAUSEA AND VOMITING THAT IS NOT CONTROLLED WITH YOUR NAUSEA MEDICATION  *UNUSUAL SHORTNESS OF BREATH  *UNUSUAL BRUISING OR BLEEDING  TENDERNESS IN MOUTH AND THROAT WITH OR WITHOUT PRESENCE OF ULCERS  *URINARY PROBLEMS  *BOWEL PROBLEMS  UNUSUAL RASH Items with * indicate a potential emergency and should be followed up as soon as possible.  Feel free to call the clinic you have any questions or concerns. The clinic phone number is [phone].  Please show the CHEMO ALERT CARD at check-in to the Emergency Department and triage nurse.   

## 2015-12-20 ENCOUNTER — Ambulatory Visit
Admission: RE | Admit: 2015-12-20 | Discharge: 2015-12-20 | Disposition: A | Discharge status: Home / Self Care | Payer: Medicare PPO | Source: Ambulatory Visit | Attending: Radiation Oncology | Admitting: Radiation Oncology

## 2015-12-20 DIAGNOSIS — Z510 Encounter for antineoplastic radiation therapy: Secondary | ICD-10-CM | POA: Diagnosis not present

## 2015-12-21 ENCOUNTER — Encounter: Payer: Self-pay | Admitting: Nurse Practitioner

## 2015-12-21 ENCOUNTER — Ambulatory Visit
Admission: RE | Admit: 2015-12-21 | Discharge: 2015-12-21 | Disposition: A | Discharge status: Home / Self Care | Payer: Medicare PPO | Source: Ambulatory Visit | Attending: Radiation Oncology | Admitting: Radiation Oncology

## 2015-12-21 DIAGNOSIS — Z510 Encounter for antineoplastic radiation therapy: Secondary | ICD-10-CM | POA: Diagnosis not present

## 2015-12-21 NOTE — Progress Notes (Signed)
Established Visit  Christian Espinoza 70 y.o. male diagnosed with lung cancer.  Currently undergoing carboplatin/Taxol chemotherapy regimen and radiation treatments.  Patient presented to the [facility] today to receive his next cycle of chemotherapy.  He has no new complaints whatsoever.   Oncology History   Patient presented with cough and SOB.  Work up showed left lung mass.   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     09/26/2015 Imaging    PET IMPRESSION: Markedly hypermetabolic 2.8 cm left lower lobe pulmonary nodule shows central necrosis. Imaging features are compatible with primary bronchogenic carcinoma. Hypermetabolic AP window and subcarinal lymph nodes     10/04/2015 Imaging    MRI Brain IMPRESSION: Negative for metastatic disease to the brain.      10/06/2015 Procedure    CT Biopsy IMPRESSION: Successful CT-guided core biopsies of the left lung lesion.      11/10/2015 Surgery    Electromagnetic navigational bronchoscopy with needle aspirations, brushings, biopsies, and GenCut biopsies. Endobronchial ultrasound with mediastinal lymph node aspirations.     11/17/2015 Initial Diagnosis    Stage III squamous cell carcinoma of left lung ([place])     11/18/2015 -  Radiation Therapy    SIM     11/28/2015 -  Chemotherapy    1st chemo Carbo/Taxol     11/29/2015 -  Radiation Therapy    1st radiation treatment      Review of Systems  All other systems reviewed and are negative.   Past Medical History:  Diagnosis Date  . Anxiety   . Cataract   . COPD (chronic obstructive pulmonary disease) ([place])    . Depression   . Encounter for antineoplastic chemotherapy 11/17/2015  . HTN (hypertension)   . Lung nodule    left lower lobe lung nodule, adenopathy  . Shortness of breath dyspnea    with exertion     Past Surgical History:  Procedure Laterality Date  . APPENDECTOMY    . EYE SURGERY     cataracts removed  . VIDEO BRONCHOSCOPY WITH ENDOBRONCHIAL NAVIGATION N/A 11/10/2015   Procedure: VIDEO BRONCHOSCOPY WITH ENDOBRONCHIAL NAVIGATION;  Surgeon: Melrose Nakayama, MD;  Location: [place];  Service: Thoracic;  Laterality: N/A;  . VIDEO BRONCHOSCOPY WITH ENDOBRONCHIAL ULTRASOUND N/A 11/10/2015   Procedure: VIDEO BRONCHOSCOPY WITH ENDOBRONCHIAL ULTRASOUND;  Surgeon: Melrose Nakayama, MD;  Location: [place];  Service: Thoracic;  Laterality: N/A;    has Stage III squamous cell carcinoma of left lung ([place]) and Encounter for antineoplastic chemotherapy on his problem list.    has No Known Allergies.    Medication List       Accurate as of 12/19/15 11:59 PM. Always use your most recent med list.          acetaminophen 325 MG tablet Commonly known as:  TYLENOL Take 650 mg by mouth.   albuterol 108 (90 Base) MCG/ACT inhaler Commonly known as:  PROVENTIL HFA;VENTOLIN HFA Inhale into the lungs.   amLODipine 5 MG tablet Commonly known as:  NORVASC Take 5 mg by mouth  daily.   amLODipine 5 MG tablet Commonly known as:  NORVASC Take 5 mg by mouth.   bisacodyl 5 MG EC tablet Commonly known as:  DULCOLAX Take 10 mg by mouth daily as needed for moderate constipation.   buPROPion 150 MG 12 hr tablet Commonly known as:  WELLBUTRIN SR Take 150 mg by mouth daily.   gabapentin 300 MG capsule Commonly known as:  NEURONTIN Take 300 mg by mouth 2 (two) times daily.   hydrOXYzine 50 MG tablet Commonly known as:  ATARAX/VISTARIL Take 50 mg by mouth every 6 (six) hours as needed for anxiety.   ipratropium-albuterol 0.5-2.5 (3) MG/3ML Soln Commonly known as:  DUONEB Take 3 mLs by  nebulization every 6 (six) hours.   LORazepam 0.5 MG tablet Commonly known as:  ATIVAN TK 1 T PO QD PRA   predniSONE 10 MG tablet Commonly known as:  DELTASONE Take 10 mg by mouth daily with breakfast.   PRESERVISION AREDS Tabs Take 1 tablet by mouth 2 (two) times daily.   prochlorperazine 10 MG tablet Commonly known as:  COMPAZINE Take 1 tablet (10 mg total) by mouth every 6 (six) hours as needed for nausea or vomiting.   [place] 1 application into the right eye every 4 (four) hours as needed (Right eye irritation).   REFRESH OP Apply 1 drop to eye 2 (two) times daily.   risperiDONE 2 MG tablet Commonly known as:  RISPERDAL Take 2 mg by mouth at bedtime.   senna 8.6 MG tablet Commonly known as:  SENOKOT Take 1 tablet by mouth every evening.   sucralfate 1 g tablet Commonly known as:  CARAFATE Take 1 tablet (1 g total) by mouth 4 (four) times daily -  with meals and at bedtime. 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 Reported on 11/30/2015   traZODone 150 MG tablet Commonly known as:  DESYREL Take 150 mg by mouth at bedtime.        PHYSICAL EXAMINATION  Oncology Vitals 12/19/2015 12/16/2015  Height 173 cm -  Weight 80.287 kg 78.971 kg  Weight (lbs) 177 lbs 174 lbs 2 oz  BMI (kg/m2) 26.91 kg/m2 26.47 kg/m2  Temp 98.1 97.8  Pulse 90 73  Resp 19 16  SpO2 98 100  BSA (m2) 1.96 m2 1.95 m2   BP Readings from Last 2 Encounters:  12/19/15 111/62  12/16/15 106/66    Physical Exam  Constitutional: He is oriented to person, place, and time and well-developed, well-nourished, and in no distress.  HENT:  Head: Normocephalic and atraumatic.  Mouth/Throat: Oropharynx is clear and moist.  Eyes: Conjunctivae and EOM are normal. Pupils are equal, round, and reactive to light. Right eye exhibits no discharge. Left  eye exhibits no discharge. No scleral icterus.  Neck: Normal range of motion. Neck supple. No JVD present. No tracheal deviation present. No thyromegaly present.  Cardiovascular: Normal rate, regular rhythm, normal heart sounds and intact distal pulses.   Pulmonary/Chest: Effort normal and breath sounds normal. No respiratory distress. He has no wheezes. He has no rales. He exhibits no tenderness.  Abdominal: Soft. Bowel sounds are normal. He exhibits no distension and no mass. There is no tenderness. There is no rebound and no guarding.  Musculoskeletal: Normal range of motion. He exhibits no edema, tenderness or deformity.  Lymphadenopathy:  He has no cervical adenopathy.  Neurological: He is alert and oriented to person, place, and time. Gait normal.  Skin: Skin is warm and dry. No rash noted. No erythema. No pallor.  Psychiatric: Affect normal.  Nursing note and vitals reviewed.   LABORATORY DATA:. Appointment on 12/19/2015  Component Date Value Ref Range Status  . WBC 12/19/2015 4.3  4.0 - 10.3 10e3/uL Final  . NEUT# 12/19/2015 3.5  1.5 - 6.5 10e3/uL Final  . HGB 12/19/2015 13.7  13.0 - 17.1 g/dL Final  . HCT 12/19/2015 41.2  38.4 - 49.9 % Final  . Platelets 12/19/2015 158  140 - 400 10e3/uL Final  . MCV 12/19/2015 85.2  79.3 - 98.0 fL Final  . MCH 12/19/2015 28.3  27.2 - 33.4 pg Final  . MCHC 12/19/2015 33.2  32.0 - 36.0 g/dL Final  . RBC 12/19/2015 4.83  4.20 - 5.82 10e6/uL Final  . RDW 12/19/2015 13.3  11.0 - 14.6 % Final  . lymph# 12/19/2015 0.4* 0.9 - 3.3 10e3/uL Final  . MONO# 12/19/2015 0.3  0.1 - 0.9 10e3/uL Final  . Eosinophils Absolute 12/19/2015 0.0  0.0 - 0.5 10e3/uL Final  . Basophils Absolute 12/19/2015 0.0  0.0 - 0.1 10e3/uL Final  . NEUT% 12/19/2015 81.6* 39.0 - 75.0 % Final  . LYMPH% 12/19/2015 10.4* 14.0 - 49.0 % Final  . MONO% 12/19/2015 6.3  0.0 - 14.0 % Final  . EOS% 12/19/2015 1.0  0.0 - 7.0 % Final  . BASO% 12/19/2015 0.7  0.0 - 2.0 % Final  . Sodium  12/19/2015 135* 136 - 145 mEq/L Final  . Potassium 12/19/2015 4.3  3.5 - 5.1 mEq/L Final  . Chloride 12/19/2015 101  98 - 109 mEq/L Final  . CO2 12/19/2015 26  22 - 29 mEq/L Final  . Glucose 12/19/2015 99  70 - 140 mg/dl Final  . BUN 12/19/2015 11.8  7.0 - 26.0 mg/dL Final  . Creatinine 12/19/2015 1.0  0.7 - 1.3 mg/dL Final  . Total Bilirubin 12/19/2015 0.46  0.20 - 1.20 mg/dL Final  . Alkaline Phosphatase 12/19/2015 80  40 - 150 U/L Final  . AST 12/19/2015 10  5 - 34 U/L Final  . ALT 12/19/2015 16  0 - 55 U/L Final  . Total Protein 12/19/2015 6.0* 6.4 - 8.3 g/dL Final  . Albumin 12/19/2015 3.4* 3.5 - 5.0 g/dL Final  . Calcium 12/19/2015 8.8  8.4 - 10.4 mg/dL Final  . Anion Gap 12/19/2015 8  3 - 11 mEq/L Final  . EGFR 12/19/2015 79* >90 ml/min/1.73 m2 Final    RADIOGRAPHIC STUDIES: No results found.  ASSESSMENT/PLAN:    Stage III squamous cell carcinoma of left lung [facility]) Patient presents to the [facility] today to receive cycle 4 of his carboplatin/Taxol chemotherapy regimen.  He also continues with daily radiation treatments.  He states he's been doing fairly well recently.  He denies any recent fevers or chills.  Blood counts obtained today were all essentially within normal limits.  It was noted that patient's sodium was slightly low at 135.  Patient was encouraged to push fluids is much as possible to stay hydrated.  Patient will proceed today with cycle 4 of his chemotherapy as planned.  He is scheduled to return for labs and chemotherapy only on 12/26/2015.  He is scheduled for labs, visit, chemotherapy on 01/02/2016.   Patient stated understanding of all instructions; and was in agreement with this plan of care. The patient knows to call the  clinic with any problems, questions or concerns.   Total time spent with patient was 15 minutes;  with greater than 75 percent of that time spent in face to face counseling regarding patient's symptoms,  and coordination of care and  follow up.  Disclaimer:This dictation was prepared with Dragon/digital dictation along with [employer]. Any transcriptional errors that result from this process are unintentional.  Drue Second, NP 12/21/2015

## 2015-12-21 NOTE — Assessment & Plan Note (Signed)
Patient presents to the [facility] today to receive cycle 4 of his carboplatin/Taxol chemotherapy regimen.  He also continues with daily radiation treatments.  He states he's been doing fairly well recently.  He denies any recent fevers or chills.  Blood counts obtained today were all essentially within normal limits.  It was noted that patient's sodium was slightly low at 135.  Patient was encouraged to push fluids is much as possible to stay hydrated.  Patient will proceed today with cycle 4 of his chemotherapy as planned.  He is scheduled to return for labs and chemotherapy only on 12/26/2015.  He is scheduled for labs, visit, chemotherapy on 01/02/2016.

## 2015-12-22 ENCOUNTER — Ambulatory Visit
Admission: RE | Admit: 2015-12-22 | Discharge: 2015-12-22 | Disposition: A | Discharge status: Home / Self Care | Payer: Medicare PPO | Source: Ambulatory Visit | Attending: Radiation Oncology | Admitting: Radiation Oncology

## 2015-12-22 DIAGNOSIS — Z510 Encounter for antineoplastic radiation therapy: Secondary | ICD-10-CM | POA: Diagnosis not present

## 2015-12-23 ENCOUNTER — Ambulatory Visit
Admission: RE | Admit: 2015-12-23 | Discharge: 2015-12-23 | Disposition: A | Discharge status: Home / Self Care | Payer: Medicare PPO | Source: Ambulatory Visit | Attending: Radiation Oncology | Admitting: Radiation Oncology

## 2015-12-23 VITALS — BP 119/56 | HR 52 | Temp 97.7°F | Resp 20 | Wt 173.7 lb

## 2015-12-23 DIAGNOSIS — Z510 Encounter for antineoplastic radiation therapy: Secondary | ICD-10-CM | POA: Diagnosis not present

## 2015-12-23 DIAGNOSIS — C3492 Malignant neoplasm of unspecified part of left bronchus or lung: Secondary | ICD-10-CM

## 2015-12-23 NOTE — Progress Notes (Signed)
Four pound weight loss noted. Heart rate fluctuating from 32-92. Denies pain. Patient belly breathing. Reports sob is slightly increased today. Denies pain or difficulty associated with swallowing because he is using Carafate. Denies cough. Mild hyperpigmentation without desquamation of chest noted. Reports using radiaplex bid as directed. Reports great fatigue.   BP (!) 119/56   Pulse (!) 52   Temp 97.7 F (36.5 C)   Resp 20   Wt 173 lb 11.2 oz (78.8 kg)   SpO2 97%   BMI 26.41 kg/m  Wt Readings from Last 3 Encounters:  12/23/15 173 lb 11.2 oz (78.8 kg)  12/19/15 177 lb (80.3 kg)  12/16/15 174 lb 1.6 oz (79 kg)

## 2015-12-23 NOTE — Progress Notes (Signed)
  Radiation Oncology         [phone]   Name: Christian Espinoza MRN: [id number]   Date: 12/23/2015  DOB: May 05, 1946   Weekly Radiation Therapy Management    ICD-9-CM ICD-10-CM   1. 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  Four pound weight loss noted. Heart rate fluctuating from 32-92. Denies pain. Patient belly breathing. Reports sob is slightly increased today. Denies pain or difficulty associated with swallowing because he is using Carafate. Denies cough. Mild hyperpigmentation without desquamation of chest noted. Reports using radiaplex bid as directed. Reports great fatigue.   Set-up films were reviewed. The chart was checked.  Physical Findings  weight is 173 lb 11.2 oz (78.8 kg). His temperature is 97.7 F (36.5 C). His blood pressure is 119/56 (abnormal) and his pulse is 52 (abnormal). His respiration is 20 and oxygen saturation is 97%. . Weight essentially stable.  No significant changes. The heart has a regular rhythm and rate with a few PVCs. Lungs are relatively clear.  Impression The patient is tolerating radiation.   Plan Continue treatment as planned.  Patient follows with his PCP for BP and Pulse     Samuella Rasool A. Tammi Klippel, M.D.    This document serves as a record of services personally performed by [name], MD. It was created on his behalf by Lendon Collar, a trained medical scribe. The creation of this record is based on the scribe's personal observations and the provider's statements to them. This document has been checked and approved by the attending provider.

## 2015-12-24 LAB — ACID FAST CULTURE WITH REFLEXED SENSITIVITIES: ACID FAST CULTURE - AFSCU3: NEGATIVE

## 2015-12-26 ENCOUNTER — Ambulatory Visit
Admission: RE | Admit: 2015-12-26 | Discharge: 2015-12-26 | Disposition: A | Discharge status: Home / Self Care | Payer: Medicare PPO | Source: Ambulatory Visit | Attending: Radiation Oncology | Admitting: Radiation Oncology

## 2015-12-26 ENCOUNTER — Encounter: Payer: Self-pay | Admitting: Nurse Practitioner

## 2015-12-26 ENCOUNTER — Ambulatory Visit (HOSPITAL_BASED_OUTPATIENT_CLINIC_OR_DEPARTMENT_OTHER): Payer: Medicare PPO | Admitting: Nurse Practitioner

## 2015-12-26 ENCOUNTER — Ambulatory Visit (HOSPITAL_BASED_OUTPATIENT_CLINIC_OR_DEPARTMENT_OTHER): Payer: Medicare PPO

## 2015-12-26 ENCOUNTER — Other Ambulatory Visit (HOSPITAL_BASED_OUTPATIENT_CLINIC_OR_DEPARTMENT_OTHER): Payer: Medicare PPO

## 2015-12-26 VITALS — BP 115/81 | HR 95 | Temp 98.5°F | Resp 18

## 2015-12-26 DIAGNOSIS — C3492 Malignant neoplasm of unspecified part of left bronchus or lung: Secondary | ICD-10-CM | POA: Diagnosis not present

## 2015-12-26 DIAGNOSIS — R062 Wheezing: Secondary | ICD-10-CM | POA: Diagnosis not present

## 2015-12-26 DIAGNOSIS — Z510 Encounter for antineoplastic radiation therapy: Secondary | ICD-10-CM | POA: Diagnosis not present

## 2015-12-26 DIAGNOSIS — Z5111 Encounter for antineoplastic chemotherapy: Secondary | ICD-10-CM | POA: Diagnosis not present

## 2015-12-26 LAB — CBC WITH DIFFERENTIAL/PLATELET
BASO%: 0.3 % (ref 0.0–2.0)
BASOS ABS: 0 10*3/uL (ref 0.0–0.1)
EOS ABS: 0 10*3/uL (ref 0.0–0.5)
EOS%: 0.8 % (ref 0.0–7.0)
HCT: 39.4 % (ref 38.4–49.9)
HGB: 13.4 g/dL (ref 13.0–17.1)
LYMPH%: 7.1 % — AB (ref 14.0–49.0)
MCH: 28.6 pg (ref 27.2–33.4)
MCHC: 34 g/dL (ref 32.0–36.0)
MCV: 84.2 fL (ref 79.3–98.0)
MONO#: 0.3 10*3/uL (ref 0.1–0.9)
MONO%: 7.6 % (ref 0.0–14.0)
NEUT#: 3 10*3/uL (ref 1.5–6.5)
NEUT%: 84.2 % — ABNORMAL HIGH (ref 39.0–75.0)
PLATELETS: 110 10*3/uL — AB (ref 140–400)
RBC: 4.68 10*6/uL (ref 4.20–5.82)
RDW: 13.3 % (ref 11.0–14.6)
WBC: 3.5 10*3/uL — AB (ref 4.0–10.3)
lymph#: 0.3 10*3/uL — ABNORMAL LOW (ref 0.9–3.3)

## 2015-12-26 LAB — COMPREHENSIVE METABOLIC PANEL
ALT: 14 U/L (ref 0–55)
ANION GAP: 10 meq/L (ref 3–11)
AST: 11 U/L (ref 5–34)
Albumin: 3.6 g/dL (ref 3.5–5.0)
Alkaline Phosphatase: 79 U/L (ref 40–150)
BILIRUBIN TOTAL: 0.6 mg/dL (ref 0.20–1.20)
BUN: 11.5 mg/dL (ref 7.0–26.0)
CHLORIDE: 103 meq/L (ref 98–109)
CO2: 25 meq/L (ref 22–29)
Calcium: 9.1 mg/dL (ref 8.4–10.4)
Creatinine: 0.9 mg/dL (ref 0.7–1.3)
EGFR: 83 mL/min/{1.73_m2} — AB (ref >90)
GLUCOSE: 76 mg/dL (ref 70–140)
POTASSIUM: 3.7 meq/L (ref 3.5–5.1)
SODIUM: 138 meq/L (ref 136–145)
TOTAL PROTEIN: 6.3 g/dL — AB (ref 6.4–8.3)

## 2015-12-26 MED ORDER — SODIUM CHLORIDE 0.9 % IV SOLN
Freq: Once | INTRAVENOUS | Status: AC
  Administered 2015-12-26: 13:00:00 via INTRAVENOUS

## 2015-12-26 MED ORDER — FAMOTIDINE IN NACL 20-0.9 MG/50ML-% IV SOLN
20.0000 mg | Freq: Once | INTRAVENOUS | Status: AC
  Administered 2015-12-26: 20 mg via INTRAVENOUS

## 2015-12-26 MED ORDER — FAMOTIDINE IN NACL 20-0.9 MG/50ML-% IV SOLN
INTRAVENOUS | Status: AC
  Filled 2015-12-26: qty 50

## 2015-12-26 MED ORDER — PALONOSETRON HCL INJECTION 0.25 MG/5ML
INTRAVENOUS | Status: AC
  Filled 2015-12-26: qty 5

## 2015-12-26 MED ORDER — ALBUTEROL SULFATE (2.5 MG/3ML) 0.083% IN NEBU
INHALATION_SOLUTION | RESPIRATORY_TRACT | Status: AC
  Filled 2015-12-26: qty 3

## 2015-12-26 MED ORDER — DIPHENHYDRAMINE HCL 50 MG/ML IJ SOLN
INTRAMUSCULAR | Status: AC
  Filled 2015-12-26: qty 1

## 2015-12-26 MED ORDER — ALBUTEROL SULFATE (2.5 MG/3ML) 0.083% IN NEBU
2.5000 mg | INHALATION_SOLUTION | Freq: Once | RESPIRATORY_TRACT | Status: AC
  Administered 2015-12-26: 2.5 mg via RESPIRATORY_TRACT
  Filled 2015-12-26: qty 3

## 2015-12-26 MED ORDER — CARBOPLATIN CHEMO INJECTION 450 MG/45ML
184.8000 mg | Freq: Once | INTRAVENOUS | Status: AC
  Administered 2015-12-26: 180 mg via INTRAVENOUS
  Filled 2015-12-26: qty 18

## 2015-12-26 MED ORDER — PACLITAXEL CHEMO INJECTION 300 MG/50ML
45.0000 mg/m2 | Freq: Once | INTRAVENOUS | Status: AC
  Administered 2015-12-26: 90 mg via INTRAVENOUS
  Filled 2015-12-26: qty 15

## 2015-12-26 MED ORDER — DIPHENHYDRAMINE HCL 50 MG/ML IJ SOLN
50.0000 mg | Freq: Once | INTRAMUSCULAR | Status: AC
  Administered 2015-12-26: 50 mg via INTRAVENOUS

## 2015-12-26 MED ORDER — SODIUM CHLORIDE 0.9 % IV SOLN
20.0000 mg | Freq: Once | INTRAVENOUS | Status: AC
  Administered 2015-12-26: 20 mg via INTRAVENOUS
  Filled 2015-12-26: qty 2

## 2015-12-26 MED ORDER — PALONOSETRON HCL INJECTION 0.25 MG/5ML
0.2500 mg | Freq: Once | INTRAVENOUS | Status: AC
  Administered 2015-12-26: 0.25 mg via INTRAVENOUS

## 2015-12-26 NOTE — Assessment & Plan Note (Signed)
Patient presented to the [facility] today to receive cycle 5 of his carboplatin/Taxol chemotherapy regimen.  He also continues with daily radiation treatments.  Patient is scheduled to return for labs, visit, and chemotherapy again on 01/02/2016.

## 2015-12-26 NOTE — Progress Notes (Signed)
SYMPTOM MANAGEMENT CLINIC    Chief Complaint: Wheezing  HPI:  Christian Espinoza 70 y.o. male diagnosed with lung cancer.  Currently undergoing carboplatin/Taxol chemotherapy regimen and radiation treatments.  Patient presented to the [facility] today.  Receive his next labs/Taxol chemotherapy regimen.  He was noted to have audible wheezing while in the infusion room at the beginning of his chemotherapy treatment today.  Patient states that he typically uses albuterol nebulizer treatments every 6 hours while at home; but had not had a nebulizer treatment today.  He denied any other new symptoms whatsoever.   Oncology History   Patient presented with cough and SOB.  Work up showed left lung mass.   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     09/26/2015 Imaging    PET IMPRESSION: Markedly hypermetabolic 2.8 cm left lower lobe pulmonary nodule shows central necrosis. Imaging features are compatible with primary bronchogenic carcinoma. Hypermetabolic AP window and subcarinal lymph nodes     10/04/2015 Imaging    MRI Brain IMPRESSION: Negative for metastatic disease to the brain.      10/06/2015 Procedure    CT Biopsy IMPRESSION: Successful CT-guided core biopsies of the left lung lesion.      11/10/2015 Surgery    Electromagnetic navigational bronchoscopy with needle aspirations, brushings, biopsies, and GenCut biopsies. Endobronchial ultrasound with mediastinal lymph node aspirations.     11/17/2015 Initial Diagnosis    Stage III squamous cell carcinoma of left lung ([place])     11/18/2015 -  Radiation Therapy   SIM     11/28/2015 -  Chemotherapy    1st chemo Carbo/Taxol     11/29/2015 -  Radiation Therapy    1st radiation treatment      Review of Systems  Respiratory: Positive for wheezing.   All other systems reviewed and are negative.   Past Medical History:  Diagnosis Date  . Anxiety   . Cataract   . COPD (chronic obstructive pulmonary disease) ([place])   . Depression   . Encounter for antineoplastic chemotherapy 11/17/2015  . HTN (hypertension)   . Lung nodule    left lower lobe lung nodule, adenopathy  . Shortness of breath dyspnea    with exertion     Past Surgical History:  Procedure Laterality Date  . APPENDECTOMY    . EYE SURGERY     cataracts removed  . VIDEO BRONCHOSCOPY WITH ENDOBRONCHIAL NAVIGATION N/A 11/10/2015   Procedure: VIDEO BRONCHOSCOPY WITH ENDOBRONCHIAL NAVIGATION;  Surgeon: Melrose Nakayama, MD;  Location: [facility];  Service: Thoracic;  Laterality: N/A;  . VIDEO BRONCHOSCOPY WITH ENDOBRONCHIAL ULTRASOUND N/A 11/10/2015   Procedure: VIDEO BRONCHOSCOPY WITH ENDOBRONCHIAL ULTRASOUND;  Surgeon: Melrose Nakayama, MD;  Location: [facility];  Service: Thoracic;  Laterality: N/A;    has Stage III squamous cell carcinoma of left lung ([place]); Encounter for antineoplastic chemotherapy; and Wheezing on his problem list.    has No Known Allergies.    Medication List       Accurate as of 12/26/15  4:29 PM. Always use your most  recent med list.          acetaminophen 325 MG tablet Commonly known as:  TYLENOL Take 650 mg by mouth.   albuterol 108 (90 Base) MCG/ACT inhaler Commonly known as:  PROVENTIL HFA;VENTOLIN HFA Inhale into the lungs.   amLODipine 5 MG tablet Commonly known as:  NORVASC Take 5 mg by mouth daily.   amLODipine 5 MG tablet Commonly known as:  NORVASC Take 5 mg by mouth.   bisacodyl 5 MG EC tablet Commonly known as:  DULCOLAX Take 10 mg by mouth daily as needed for moderate constipation.   buPROPion 150 MG 12 hr tablet Commonly known  as:  WELLBUTRIN SR Take 150 mg by mouth daily.   gabapentin 300 MG capsule Commonly known as:  NEURONTIN Take 300 mg by mouth 2 (two) times daily.   hydrOXYzine 50 MG tablet Commonly known as:  ATARAX/VISTARIL Take 50 mg by mouth every 6 (six) hours as needed for anxiety.   ipratropium-albuterol 0.5-2.5 (3) MG/3ML Soln Commonly known as:  DUONEB Take 3 mLs by nebulization every 6 (six) hours.   LORazepam 0.5 MG tablet Commonly known as:  ATIVAN TK 1 T PO QD PRA   predniSONE 10 MG tablet Commonly known as:  DELTASONE Take 10 mg by mouth daily with breakfast.   PRESERVISION AREDS Tabs Take 1 tablet by mouth 2 (two) times daily.   prochlorperazine 10 MG tablet Commonly known as:  COMPAZINE Take 1 tablet (10 mg total) by mouth every 6 (six) hours as needed for nausea or vomiting.   [place] 1 application into the right eye every 4 (four) hours as needed (Right eye irritation).   REFRESH OP Apply 1 drop to eye 2 (two) times daily.   risperiDONE 2 MG tablet Commonly known as:  RISPERDAL Take 2 mg by mouth at bedtime.   senna 8.6 MG tablet Commonly known as:  SENOKOT Take 1 tablet by mouth every evening.   sucralfate 1 g tablet Commonly known as:  CARAFATE Take 1 tablet (1 g total) by mouth 4 (four) times daily -  with meals and at bedtime. 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 Reported on 11/30/2015   traZODone 150 MG tablet Commonly known as:  DESYREL Take 150 mg by mouth at bedtime.        PHYSICAL EXAMINATION  Oncology Vitals 12/26/2015 12/23/2015  Height - -  Weight - 78.79 kg  Weight (lbs) - 173 lbs 11 oz  BMI (kg/m2) - 26.41 kg/m2  Temp 98.5 97.7  Pulse 95 52  Resp 18 20  SpO2 96 97  BSA (m2) - 1.94 m2   BP Readings from Last 2 Encounters:  12/26/15 115/81  12/23/15 (!) 119/56    Physical  Exam  Constitutional: He is oriented to person, place, and time. Vital signs are normal. He appears unhealthy.  HENT:  Head: Normocephalic and atraumatic.  Eyes: Conjunctivae and EOM are normal. Pupils are equal, round, and reactive to light.  Neck: Normal range of motion.  Pulmonary/Chest: Effort normal. He has wheezes.  Musculoskeletal: Normal range of motion.  Neurological: He is alert and oriented to person, place, and time.  Skin: Skin is warm and dry.  Psychiatric: Affect normal.  Nursing note and vitals reviewed.   LABORATORY DATA:. Appointment on 12/26/2015  Component Date Value Ref Range  Status  . WBC 12/26/2015 3.5* 4.0 - 10.3 10e3/uL Final  . NEUT# 12/26/2015 3.0  1.5 - 6.5 10e3/uL Final  . HGB 12/26/2015 13.4  13.0 - 17.1 g/dL Final  . HCT 12/26/2015 39.4  38.4 - 49.9 % Final  . Platelets 12/26/2015 110* 140 - 400 10e3/uL Final  . MCV 12/26/2015 84.2  79.3 - 98.0 fL Final  . MCH 12/26/2015 28.6  27.2 - 33.4 pg Final  . MCHC 12/26/2015 34.0  32.0 - 36.0 g/dL Final  . RBC 12/26/2015 4.68  4.20 - 5.82 10e6/uL Final  . RDW 12/26/2015 13.3  11.0 - 14.6 % Final  . lymph# 12/26/2015 0.3* 0.9 - 3.3 10e3/uL Final  . MONO# 12/26/2015 0.3  0.1 - 0.9 10e3/uL Final  . Eosinophils Absolute 12/26/2015 0.0  0.0 - 0.5 10e3/uL Final  . Basophils Absolute 12/26/2015 0.0  0.0 - 0.1 10e3/uL Final  . NEUT% 12/26/2015 84.2* 39.0 - 75.0 % Final  . LYMPH% 12/26/2015 7.1* 14.0 - 49.0 % Final  . MONO% 12/26/2015 7.6  0.0 - 14.0 % Final  . EOS% 12/26/2015 0.8  0.0 - 7.0 % Final  . BASO% 12/26/2015 0.3  0.0 - 2.0 % Final  . Sodium 12/26/2015 138  136 - 145 mEq/L Final  . Potassium 12/26/2015 3.7  3.5 - 5.1 mEq/L Final  . Chloride 12/26/2015 103  98 - 109 mEq/L Final  . CO2 12/26/2015 25  22 - 29 mEq/L Final  . Glucose 12/26/2015 76  70 - 140 mg/dl Final  . BUN 12/26/2015 11.5  7.0 - 26.0 mg/dL Final  . Creatinine 12/26/2015 0.9  0.7 - 1.3 mg/dL Final  . Total Bilirubin 12/26/2015 0.60  0.20  - 1.20 mg/dL Final  . Alkaline Phosphatase 12/26/2015 79  40 - 150 U/L Final  . AST 12/26/2015 11  5 - 34 U/L Final  . ALT 12/26/2015 14  0 - 55 U/L Final  . Total Protein 12/26/2015 6.3* 6.4 - 8.3 g/dL Final  . Albumin 12/26/2015 3.6  3.5 - 5.0 g/dL Final  . Calcium 12/26/2015 9.1  8.4 - 10.4 mg/dL Final  . Anion Gap 12/26/2015 10  3 - 11 mEq/L Final  . EGFR 12/26/2015 83* >90 ml/min/1.73 m2 Final    RADIOGRAPHIC STUDIES: No results found.  ASSESSMENT/PLAN:    Wheezing Patient states that he typically uses his albuterol nebulizer treatment every 6 hours when at home.  However, today he states he has had no nebulizer treatments.  He is wheezing audibly; and requested an albuterol treatment.  Exam today revealed wheezing to all lung fields.  No acute respiratory distress.  Vital signs stable.  Patient will receive albuterol nebulizer treatments 2 while in the infusion room.  Patient was advised to continue with his nebulizer treatments at home as previously directed.  Stage III squamous cell carcinoma of left lung [facility]) Patient presented to the [facility] today to receive cycle 5 of his carboplatin/Taxol chemotherapy regimen.  He also continues with daily radiation treatments.  Patient is scheduled to return for labs, visit, and chemotherapy again on 01/02/2016.   Patient stated understanding of all instructions; and was in agreement with this plan of care. The patient knows to call the clinic with any problems, questions or concerns.   Total time spent with patient was 15 minutes;  with greater than 75 percent of that time spent in face to face counseling regarding patient's symptoms,  and coordination of care and follow up.  Disclaimer:This dictation was  prepared with Dragon/digital dictation along with [employer]. Any transcriptional errors that result from this process are unintentional.  Drue Second, NP 12/26/2015

## 2015-12-26 NOTE — Assessment & Plan Note (Signed)
Patient states that he typically uses his albuterol nebulizer treatment every 6 hours when at home.  However, today he states he has had no nebulizer treatments.  He is wheezing audibly; and requested an albuterol treatment.  Exam today revealed wheezing to all lung fields.  No acute respiratory distress.  Vital signs stable.  Patient will receive albuterol nebulizer treatments 2 while in the infusion room.  Patient was advised to continue with his nebulizer treatments at home as previously directed.

## 2015-12-26 NOTE — Patient Instructions (Signed)
[facility] Discharge Instructions for Patients Receiving Chemotherapy  Today you received the following chemotherapy agents :  Taxol,  Carboplatin.  To help prevent nausea and vomiting after your treatment, we encourage you to take your nausea medication as prescribed.   If you develop nausea and vomiting that is not controlled by your nausea medication, call the clinic.   BELOW ARE SYMPTOMS THAT SHOULD BE REPORTED IMMEDIATELY:  *FEVER GREATER THAN 100.5 F  *CHILLS WITH OR WITHOUT FEVER  NAUSEA AND VOMITING THAT IS NOT CONTROLLED WITH YOUR NAUSEA MEDICATION  *UNUSUAL SHORTNESS OF BREATH  *UNUSUAL BRUISING OR BLEEDING  TENDERNESS IN MOUTH AND THROAT WITH OR WITHOUT PRESENCE OF ULCERS  *URINARY PROBLEMS  *BOWEL PROBLEMS  UNUSUAL RASH Items with * indicate a potential emergency and should be followed up as soon as possible.  Feel free to call the clinic you have any questions or concerns. The clinic phone number is [phone].  Please show the CHEMO ALERT CARD at check-in to the Emergency Department and triage nurse.   

## 2015-12-27 ENCOUNTER — Ambulatory Visit
Admission: RE | Admit: 2015-12-27 | Discharge: 2015-12-27 | Disposition: A | Discharge status: Home / Self Care | Payer: Medicare PPO | Source: Ambulatory Visit | Attending: Radiation Oncology | Admitting: Radiation Oncology

## 2015-12-27 DIAGNOSIS — Z510 Encounter for antineoplastic radiation therapy: Secondary | ICD-10-CM | POA: Diagnosis not present

## 2015-12-28 ENCOUNTER — Ambulatory Visit
Admission: RE | Admit: 2015-12-28 | Discharge: 2015-12-28 | Disposition: A | Discharge status: Home / Self Care | Payer: Medicare PPO | Source: Ambulatory Visit | Attending: Radiation Oncology | Admitting: Radiation Oncology

## 2015-12-28 VITALS — BP 103/67 | HR 99 | Resp 20 | Wt 175.0 lb

## 2015-12-28 DIAGNOSIS — C3492 Malignant neoplasm of unspecified part of left bronchus or lung: Secondary | ICD-10-CM

## 2015-12-28 DIAGNOSIS — Z510 Encounter for antineoplastic radiation therapy: Secondary | ICD-10-CM | POA: Diagnosis not present

## 2015-12-28 NOTE — Progress Notes (Signed)
  Radiation Oncology         [phone] ________________________________  Name: Christian Espinoza MRN: [id number]  Date: 12/28/2015  DOB: 04/30/1946  Weekly Radiation Therapy Management    ICD-9-CM ICD-10-CM   1. 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 . . . . . . . The patient presents for routine under treatment assessment.                                   .Denies pain. Patient belly breathing. Reports sob is slightly continues. Denies pain or difficulty associated with swallowing because he is using Carafate. Denies cough. Mild hyperpigmentation without desquamation of chest noted. Reports using radiaplex bid as directed. Reports great fatigue                                 Set-up films were reviewed.                                 The chart was checked. Physical Findings. . .  weight is 175 lb (79.4 kg). His blood pressure is 103/67 and his pulse is 99. His respiration is 20 and oxygen saturation is 98%. . Patient exhibits some expiratory wheezing bilaterally but did not use inhaler or nebulizer this morning. the heart has a regular rhythm and rate Impression . . . . . . . The patient is tolerating radiation. Plan . . . . . . . . . . . . Continue treatment as planned.  ________________________________   Blair Promise, PhD, MD

## 2015-12-28 NOTE — Progress Notes (Signed)
Weight and vitals stable.Denies pain. Patient belly breathing. Reports sob is slightly continues. Denies pain or difficulty associated with swallowing because he is using Carafate. Denies cough. Mild hyperpigmentation without desquamation of chest noted. Reports using radiaplex bid as directed. Reports great fatigue.   BP 103/67 (BP Location: Left Arm, Patient Position: Sitting, Cuff Size: Normal)   Pulse 99   Resp 20   Wt 175 lb (79.4 kg)   SpO2 98%   BMI 26.61 kg/m   Wt Readings from Last 3 Encounters:  12/28/15 175 lb (79.4 kg)  12/23/15 173 lb 11.2 oz (78.8 kg)  12/19/15 177 lb (80.3 kg)

## 2015-12-29 ENCOUNTER — Ambulatory Visit
Admission: RE | Admit: 2015-12-29 | Discharge: 2015-12-29 | Disposition: A | Discharge status: Home / Self Care | Payer: Medicare PPO | Source: Ambulatory Visit | Attending: Radiation Oncology | Admitting: Radiation Oncology

## 2015-12-29 DIAGNOSIS — Z510 Encounter for antineoplastic radiation therapy: Secondary | ICD-10-CM | POA: Diagnosis not present

## 2015-12-30 ENCOUNTER — Ambulatory Visit
Admission: RE | Admit: 2015-12-30 | Discharge: 2015-12-30 | Disposition: A | Discharge status: Home / Self Care | Payer: Medicare PPO | Source: Ambulatory Visit | Attending: Radiation Oncology | Admitting: Radiation Oncology

## 2015-12-30 DIAGNOSIS — Z510 Encounter for antineoplastic radiation therapy: Secondary | ICD-10-CM | POA: Diagnosis not present

## 2016-01-02 ENCOUNTER — Ambulatory Visit (HOSPITAL_BASED_OUTPATIENT_CLINIC_OR_DEPARTMENT_OTHER): Payer: Medicare PPO | Admitting: Internal Medicine

## 2016-01-02 ENCOUNTER — Ambulatory Visit
Admission: RE | Admit: 2016-01-02 | Discharge: 2016-01-02 | Disposition: A | Discharge status: Home / Self Care | Payer: Medicare PPO | Source: Ambulatory Visit | Attending: Radiation Oncology | Admitting: Radiation Oncology

## 2016-01-02 ENCOUNTER — Encounter: Payer: Self-pay | Admitting: Internal Medicine

## 2016-01-02 ENCOUNTER — Telehealth: Payer: Self-pay | Admitting: Internal Medicine

## 2016-01-02 ENCOUNTER — Ambulatory Visit: Payer: Medicare PPO

## 2016-01-02 ENCOUNTER — Other Ambulatory Visit (HOSPITAL_BASED_OUTPATIENT_CLINIC_OR_DEPARTMENT_OTHER): Payer: Medicare PPO

## 2016-01-02 VITALS — BP 95/70 | HR 88 | Temp 98.7°F | Resp 19 | Ht 68.0 in | Wt 175.7 lb

## 2016-01-02 DIAGNOSIS — C3492 Malignant neoplasm of unspecified part of left bronchus or lung: Secondary | ICD-10-CM

## 2016-01-02 DIAGNOSIS — Z5111 Encounter for antineoplastic chemotherapy: Secondary | ICD-10-CM

## 2016-01-02 DIAGNOSIS — R05 Cough: Secondary | ICD-10-CM | POA: Diagnosis not present

## 2016-01-02 DIAGNOSIS — C3432 Malignant neoplasm of lower lobe, left bronchus or lung: Secondary | ICD-10-CM

## 2016-01-02 DIAGNOSIS — Z510 Encounter for antineoplastic radiation therapy: Secondary | ICD-10-CM | POA: Diagnosis not present

## 2016-01-02 LAB — CBC WITH DIFFERENTIAL/PLATELET
BASO%: 0.6 % (ref 0.0–2.0)
BASOS ABS: 0 10*3/uL (ref 0.0–0.1)
EOS ABS: 0 10*3/uL (ref 0.0–0.5)
EOS%: 0.8 % (ref 0.0–7.0)
HEMATOCRIT: 37.8 % — AB (ref 38.4–49.9)
HEMOGLOBIN: 12.5 g/dL — AB (ref 13.0–17.1)
LYMPH%: 14.5 % (ref 14.0–49.0)
MCH: 27.9 pg (ref 27.2–33.4)
MCHC: 33.1 g/dL (ref 32.0–36.0)
MCV: 84.5 fL (ref 79.3–98.0)
MONO#: 0.1 10*3/uL (ref 0.1–0.9)
MONO%: 6.7 % (ref 0.0–14.0)
NEUT#: 1.2 10*3/uL — ABNORMAL LOW (ref 1.5–6.5)
NEUT%: 77.4 % — AB (ref 39.0–75.0)
Platelets: 118 10*3/uL — ABNORMAL LOW (ref 140–400)
RBC: 4.48 10*6/uL (ref 4.20–5.82)
RDW: 13.4 % (ref 11.0–14.6)
WBC: 1.5 10*3/uL — ABNORMAL LOW (ref 4.0–10.3)
lymph#: 0.2 10*3/uL — ABNORMAL LOW (ref 0.9–3.3)

## 2016-01-02 LAB — COMPREHENSIVE METABOLIC PANEL
ALBUMIN: 3.3 g/dL — AB (ref 3.5–5.0)
ALK PHOS: 75 U/L (ref 40–150)
ALT: 14 U/L (ref 0–55)
AST: 10 U/L (ref 5–34)
Anion Gap: 7 mEq/L (ref 3–11)
BUN: 12.4 mg/dL (ref 7.0–26.0)
CHLORIDE: 103 meq/L (ref 98–109)
CO2: 28 meq/L (ref 22–29)
Calcium: 9.1 mg/dL (ref 8.4–10.4)
Creatinine: 0.9 mg/dL (ref 0.7–1.3)
EGFR: 83 mL/min/{1.73_m2} — ABNORMAL LOW (ref >90)
GLUCOSE: 91 mg/dL (ref 70–140)
POTASSIUM: 4.4 meq/L (ref 3.5–5.1)
Sodium: 138 mEq/L (ref 136–145)
Total Bilirubin: 0.69 mg/dL (ref 0.20–1.20)
Total Protein: 6 g/dL — ABNORMAL LOW (ref 6.4–8.3)

## 2016-01-02 NOTE — Telephone Encounter (Signed)
GAVE PATIENT AVS REPORT AND APPOINTMENTS FOR AUGUST. PER 8/14 LOS CX CHEMO TODAY - CHEMO NEXT WEEK AS SCHEDULED AND F/U 2 WEEKS WITH MM OR APP. NOT CHEMO TREATMENT DATES IN CARE PLAN BEYOND NEXT WEEK 8/21.

## 2016-01-02 NOTE — Progress Notes (Signed)
[facility] [phone]   [phone] Multidisciplinary thoracic oncology clinic  OFFICE PROGRESS NOTE  Pcp Not In System No address on file  DIAGNOSIS: Stage IIIB (T1b, N3, M0) non-small cell lung cancer, squamous cell carcinoma diagnosed in July 2017 presented with left lower lobe lung mass in addition to suspicious mediastinal lymphadenopathy.  PRIOR THERAPY: None  CURRENT THERAPY: Concurrent chemoradiation with weekly carboplatin for AUC of 2 and paclitaxel 45 MG/M2. First dose 11/28/2015. Status post 5 cycles  INTERVAL HISTORY: Christian Espinoza 70 y.o. male returns to the clinic today for follow-up visit. The patient is currently undergoing a course of concurrent chemoradiation with weekly carboplatin and paclitaxel status post 5 cycles and he is tolerating his treatment well. He has mild odynophagia and was started on treatment with Carafate by Dr. Tammi Klippel. He continues to have mild cough but no significant shortness of breath or hemoptysis. He has no significant nausea or vomiting. He has no weight loss or night sweats. He was supposed to start cycle #6 today.  MEDICAL HISTORY: Past Medical History:  Diagnosis Date  . Anxiety   . Cataract   . COPD (chronic obstructive pulmonary disease) ([place])   . Depression   . Encounter for antineoplastic chemotherapy 11/17/2015  . HTN (hypertension)   . Lung nodule    left lower lobe lung nodule, adenopathy  . Shortness of breath dyspnea    with exertion     ALLERGIES:  has No Known Allergies.  MEDICATIONS:  Current Outpatient Prescriptions  Medication Sig Dispense Refill  . acetaminophen (TYLENOL) 325 MG tablet Take 650 mg by mouth.    Marland Kitchen albuterol (PROVENTIL HFA;VENTOLIN HFA) 108 (90 Base) MCG/ACT inhaler Inhale into the lungs.    Marland Kitchen amLODipine (NORVASC) 5 MG tablet Take 5 mg by mouth.    . Artificial Tear Ointment (REFRESH LACRI-LUBE) OINT Place 1 application into the right eye every 4 (four) hours  as needed (Right eye irritation).    . bisacodyl (DULCOLAX) 5 MG EC tablet Take 10 mg by mouth daily as needed for moderate constipation.    . gabapentin (NEURONTIN) 300 MG capsule Take 300 mg by mouth 2 (two) times daily.    . hydrOXYzine (ATARAX/VISTARIL) 50 MG tablet Take 50 mg by mouth every 6 (six) hours as needed for anxiety.    Marland Kitchen ipratropium-albuterol (DUONEB) 0.5-2.5 (3) MG/3ML SOLN Take 3 mLs by nebulization every 6 (six) hours.    Marland Kitchen LORazepam (ATIVAN) 0.5 MG tablet TK 1 T PO QD PRA  0  . Multiple Vitamins-Minerals (PRESERVISION AREDS) TABS Take 1 tablet by mouth 2 (two) times daily.    . Polyvinyl Alcohol-Povidone (REFRESH OP) Apply 1 drop to eye 2 (two) times daily.    . predniSONE (DELTASONE) 10 MG tablet Take 10 mg by mouth daily with breakfast.    . prochlorperazine (COMPAZINE) 10 MG tablet Take 1 tablet (10 mg total) by mouth every 6 (six) hours as needed for nausea or vomiting. 30 tablet 0  . risperiDONE (RISPERDAL) 2 MG tablet Take 2 mg by mouth at bedtime.     . senna (SENOKOT) 8.6 MG tablet Take 1 tablet by mouth every evening.    . sucralfate (CARAFATE) 1 g tablet Take 1 tablet (1 g total) by mouth 4 (four) times daily -  with meals and at bedtime. 5 min before meals for radiation induced esophagitis 120 tablet 2  . traMADol (ULTRAM) 50 MG tablet Take 50 mg by mouth every 12 (  twelve) hours as needed for moderate pain. Reported on 11/30/2015    . traZODone (DESYREL) 150 MG tablet Take 150 mg by mouth at bedtime.     Marland Kitchen amLODipine (NORVASC) 5 MG tablet Take 5 mg by mouth daily.     Marland Kitchen buPROPion (WELLBUTRIN SR) 150 MG 12 hr tablet Take 150 mg by mouth daily.      No current facility-administered medications for this visit.     SURGICAL HISTORY:  Past Surgical History:  Procedure Laterality Date  . APPENDECTOMY    . EYE SURGERY     cataracts removed  . VIDEO BRONCHOSCOPY WITH ENDOBRONCHIAL NAVIGATION N/A 11/10/2015   Procedure: VIDEO BRONCHOSCOPY WITH ENDOBRONCHIAL NAVIGATION;   Surgeon: Melrose Nakayama, MD;  Location: [facility];  Service: Thoracic;  Laterality: N/A;  . VIDEO BRONCHOSCOPY WITH ENDOBRONCHIAL ULTRASOUND N/A 11/10/2015   Procedure: VIDEO BRONCHOSCOPY WITH ENDOBRONCHIAL ULTRASOUND;  Surgeon: Melrose Nakayama, MD;  Location: [facility];  Service: Thoracic;  Laterality: N/A;    REVIEW OF SYSTEMS:  A comprehensive review of systems was negative except for: Constitutional: positive for fatigue Respiratory: positive for cough and dyspnea on exertion Gastrointestinal: positive for odynophagia   PHYSICAL EXAMINATION: General appearance: alert, cooperative and no distress Head: Normocephalic, without obvious abnormality, atraumatic Neck: no adenopathy, no JVD, supple, symmetrical, trachea midline and thyroid not enlarged, symmetric, no tenderness/mass/nodules Lymph nodes: Cervical, supraclavicular, and axillary nodes normal. Resp: clear to auscultation bilaterally Back: symmetric, no curvature. ROM normal. No CVA tenderness. Cardio: regular rate and rhythm, S1, S2 normal, no murmur, click, rub or gallop GI: soft, non-tender; bowel sounds normal; no masses,  no organomegaly Extremities: extremities normal, atraumatic, no cyanosis or edema Neurologic: Alert and oriented X 3, normal strength and tone. Normal symmetric reflexes. Normal coordination and gait  ECOG PERFORMANCE STATUS: 1 - Symptomatic but completely ambulatory  Blood pressure 95/70, pulse 88, temperature 98.7 F (37.1 C), temperature source Oral, resp. rate 19, height '5\' 8"'$  (1.727 m), weight 175 lb 11.2 oz (79.7 kg), SpO2 96 %.  LABORATORY DATA: Lab Results  Component Value Date   WBC 1.5 (L) 01/02/2016   HGB 12.5 (L) 01/02/2016   HCT 37.8 (L) 01/02/2016   MCV 84.5 01/02/2016   PLT 118 (L) 01/02/2016      Chemistry      Component Value Date/Time   NA 138 01/02/2016 1125   K 4.4 01/02/2016 1125   CL 103 11/10/2015 0646   CO2 28 01/02/2016 1125   BUN 12.4 01/02/2016 1125   CREATININE  0.9 01/02/2016 1125      Component Value Date/Time   CALCIUM 9.1 01/02/2016 1125   ALKPHOS 75 01/02/2016 1125   AST 10 01/02/2016 1125   ALT 14 01/02/2016 1125   BILITOT 0.69 01/02/2016 1125       RADIOGRAPHIC STUDIES: No results found.  ASSESSMENT AND PLAN: This is a very pleasant 70 years old white male with questionably stage IIIB non-small cell lung cancer, squamous cell carcinoma presenting with left lower lobe lung mass in addition to suspicious mediastinal lymphadenopathy. pending tissue diagnosis. The patient is currently undergoing a course of concurrent chemoradiation with weekly carboplatin and paclitaxel, status post 5 cycles and is tolerating his treatment well His total white blood count and absolute neutrophil count are low today. I recommended for the patient to skip cycle #6 today. He will expected to complete the course of concurrent chemoradiation next week. I will see the patient back for follow-up visit in 2 weeks for reevaluation  and consideration of ordering the restaging his scan. He was advised to call immediately if he has any concerning symptoms in the interval. The patient voices understanding of current disease status and treatment options and is in agreement with the current care plan.  All questions were answered. The patient knows to call the clinic with any problems, questions or concerns. We can certainly see the patient much sooner if necessary.  Disclaimer: This note was dictated with voice recognition software. Similar sounding words can inadvertently be transcribed and may not be corrected upon review.

## 2016-01-03 ENCOUNTER — Ambulatory Visit
Admission: RE | Admit: 2016-01-03 | Discharge: 2016-01-03 | Disposition: A | Discharge status: Home / Self Care | Payer: Medicare PPO | Source: Ambulatory Visit | Attending: Radiation Oncology | Admitting: Radiation Oncology

## 2016-01-03 DIAGNOSIS — Z510 Encounter for antineoplastic radiation therapy: Secondary | ICD-10-CM | POA: Diagnosis not present

## 2016-01-03 NOTE — Progress Notes (Signed)
Received patient in the clinic following radiation therapy. Patient's normal belly breathing pattern noted. Vitals stable. No distress noted. Exertion dyspnea noted. Reports pain and difficulty associated with swallowing eased by the use of carafate. Reports a mild cough continues but, denies its any worse. Patient states, "I feel fine."   BP 121/76 (BP Location: Left Arm, Patient Position: Sitting, Cuff Size: Normal)   Pulse 78   Temp 97.7 F (36.5 C) (Oral)   Resp 20   SpO2 93%

## 2016-01-04 ENCOUNTER — Ambulatory Visit
Admission: RE | Admit: 2016-01-04 | Discharge: 2016-01-04 | Disposition: A | Discharge status: Home / Self Care | Payer: Medicare PPO | Source: Ambulatory Visit | Attending: Radiation Oncology | Admitting: Radiation Oncology

## 2016-01-04 DIAGNOSIS — Z510 Encounter for antineoplastic radiation therapy: Secondary | ICD-10-CM | POA: Diagnosis not present

## 2016-01-05 ENCOUNTER — Ambulatory Visit
Admission: RE | Admit: 2016-01-05 | Discharge: 2016-01-05 | Disposition: A | Discharge status: Home / Self Care | Payer: Medicare PPO | Source: Ambulatory Visit | Attending: Radiation Oncology | Admitting: Radiation Oncology

## 2016-01-05 VITALS — BP 103/72 | HR 82 | Temp 97.9°F | Resp 18 | Wt 177.2 lb

## 2016-01-05 DIAGNOSIS — Z510 Encounter for antineoplastic radiation therapy: Secondary | ICD-10-CM | POA: Diagnosis not present

## 2016-01-05 DIAGNOSIS — C3492 Malignant neoplasm of unspecified part of left bronchus or lung: Secondary | ICD-10-CM

## 2016-01-05 NOTE — Progress Notes (Signed)
Weight and vitals stable. Denies pain. Reports painful and difficult swallowing continue. Reports carafate help to ease this pain while eating. Belly breathing noted. Reports a mild dry cough. Reports shortness of breath continues with exertion but, is no worse. Slight hyperpigmentation of chest wall without desquamation. Reports using radiaplex bid as directed. Understands to continues radiaplex bid for two weeks s/p completion. Reports mild fatigue. One month follow up appointment card given.   BP 103/72   Pulse 82   Temp 97.9 F (36.6 C) (Oral)   Resp 18   Wt 177 lb 3.2 oz (80.4 kg)   SpO2 95%   BMI 26.94 kg/m  Wt Readings from Last 3 Encounters:  01/05/16 177 lb 3.2 oz (80.4 kg)  01/02/16 175 lb 11.2 oz (79.7 kg)  12/28/15 175 lb (79.4 kg)

## 2016-01-05 NOTE — Progress Notes (Signed)
  Radiation Oncology         [phone]   Name: Christian Espinoza MRN: [id number]   Date: 01/05/2016  DOB: [birth year]   Weekly Radiation Therapy Management    ICD-9-CM ICD-10-CM   1. 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  Weight and vitals stable. Denies pain. Reports painful and difficulty swallowing continue. Reports carafate help to ease this pain while eating. Reports a mild dry cough. Reports shortness of breat continues with exertion but, is no worse. Reports using radiaplex bid as directed. Reports mild fatigue. Denies hemoptysis.   Set-up films were reviewed. The chart was checked.  Physical Findings  weight is 177 lb 3.2 oz (80.4 kg). His oral temperature is 97.9 F (36.6 C). His blood pressure is 103/72 and his pulse is 82. His respiration is 18 and oxygen saturation is 95%. . Weight essentially stable.  No significant changes.   Impression The patient is tolerating radiation.   Plan Continue treatment as planned. Patient was given a one month follow up appointment card.     Sheral Apley Tammi Klippel, M.D.    This document serves as a record of services personally performed by [name], MD. It was created on his behalf by Arlyce Harman, a trained medical scribe. The creation of this record is based on the scribe's personal observations and the provider's statements to them. This document has been checked and approved by the attending provider.

## 2016-01-06 ENCOUNTER — Ambulatory Visit
Admission: RE | Admit: 2016-01-06 | Discharge: 2016-01-06 | Disposition: A | Discharge status: Home / Self Care | Payer: Medicare PPO | Source: Ambulatory Visit | Attending: Radiation Oncology | Admitting: Radiation Oncology

## 2016-01-06 DIAGNOSIS — Z510 Encounter for antineoplastic radiation therapy: Secondary | ICD-10-CM | POA: Diagnosis not present

## 2016-01-09 ENCOUNTER — Other Ambulatory Visit (HOSPITAL_BASED_OUTPATIENT_CLINIC_OR_DEPARTMENT_OTHER): Payer: Medicare PPO

## 2016-01-09 ENCOUNTER — Ambulatory Visit
Admission: RE | Admit: 2016-01-09 | Discharge: 2016-01-09 | Disposition: A | Discharge status: Home / Self Care | Payer: Medicare PPO | Source: Ambulatory Visit | Attending: Radiation Oncology | Admitting: Radiation Oncology

## 2016-01-09 ENCOUNTER — Ambulatory Visit (HOSPITAL_BASED_OUTPATIENT_CLINIC_OR_DEPARTMENT_OTHER): Payer: Medicare PPO

## 2016-01-09 VITALS — BP 112/74 | HR 89 | Temp 98.1°F | Resp 18

## 2016-01-09 DIAGNOSIS — Z510 Encounter for antineoplastic radiation therapy: Secondary | ICD-10-CM | POA: Diagnosis not present

## 2016-01-09 DIAGNOSIS — C3492 Malignant neoplasm of unspecified part of left bronchus or lung: Secondary | ICD-10-CM

## 2016-01-09 DIAGNOSIS — C3432 Malignant neoplasm of lower lobe, left bronchus or lung: Secondary | ICD-10-CM

## 2016-01-09 DIAGNOSIS — Z5111 Encounter for antineoplastic chemotherapy: Secondary | ICD-10-CM

## 2016-01-09 LAB — CBC WITH DIFFERENTIAL/PLATELET
BASO%: 0.4 % (ref 0.0–2.0)
BASOS ABS: 0 10*3/uL (ref 0.0–0.1)
EOS ABS: 0 10*3/uL (ref 0.0–0.5)
EOS%: 0.6 % (ref 0.0–7.0)
HEMATOCRIT: 39.1 % (ref 38.4–49.9)
HGB: 13.1 g/dL (ref 13.0–17.1)
LYMPH#: 0.3 10*3/uL — AB (ref 0.9–3.3)
LYMPH%: 15.1 % (ref 14.0–49.0)
MCH: 28.7 pg (ref 27.2–33.4)
MCHC: 33.5 g/dL (ref 32.0–36.0)
MCV: 85.7 fL (ref 79.3–98.0)
MONO#: 0.3 10*3/uL (ref 0.1–0.9)
MONO%: 16.4 % — ABNORMAL HIGH (ref 0.0–14.0)
NEUT#: 1.2 10*3/uL — ABNORMAL LOW (ref 1.5–6.5)
NEUT%: 67.5 % (ref 39.0–75.0)
PLATELETS: 194 10*3/uL (ref 140–400)
RBC: 4.57 10*6/uL (ref 4.20–5.82)
RDW: 13.8 % (ref 11.0–14.6)
WBC: 1.8 10*3/uL — ABNORMAL LOW (ref 4.0–10.3)

## 2016-01-09 LAB — COMPREHENSIVE METABOLIC PANEL
ALT: 12 U/L (ref 0–55)
ANION GAP: 7 meq/L (ref 3–11)
AST: 10 U/L (ref 5–34)
Albumin: 3.5 g/dL (ref 3.5–5.0)
Alkaline Phosphatase: 94 U/L (ref 40–150)
BUN: 10.1 mg/dL (ref 7.0–26.0)
CALCIUM: 9.2 mg/dL (ref 8.4–10.4)
CHLORIDE: 105 meq/L (ref 98–109)
CO2: 26 mEq/L (ref 22–29)
CREATININE: 1 mg/dL (ref 0.7–1.3)
EGFR: 75 mL/min/{1.73_m2} — ABNORMAL LOW (ref >90)
Glucose: 109 mg/dl (ref 70–140)
Potassium: 4 mEq/L (ref 3.5–5.1)
Sodium: 138 mEq/L (ref 136–145)
Total Bilirubin: 0.85 mg/dL (ref 0.20–1.20)
Total Protein: 6.4 g/dL (ref 6.4–8.3)

## 2016-01-09 LAB — TECHNOLOGIST REVIEW

## 2016-01-09 MED ORDER — FAMOTIDINE IN NACL 20-0.9 MG/50ML-% IV SOLN
INTRAVENOUS | Status: AC
  Filled 2016-01-09: qty 50

## 2016-01-09 MED ORDER — SODIUM CHLORIDE 0.9 % IV SOLN
Freq: Once | INTRAVENOUS | Status: AC
  Administered 2016-01-09: 13:00:00 via INTRAVENOUS

## 2016-01-09 MED ORDER — SODIUM CHLORIDE 0.9 % IV SOLN
20.0000 mg | Freq: Once | INTRAVENOUS | Status: AC
  Administered 2016-01-09: 20 mg via INTRAVENOUS
  Filled 2016-01-09: qty 2

## 2016-01-09 MED ORDER — SODIUM CHLORIDE 0.9 % IV SOLN
45.0000 mg/m2 | Freq: Once | INTRAVENOUS | Status: AC
  Administered 2016-01-09: 90 mg via INTRAVENOUS
  Filled 2016-01-09: qty 15

## 2016-01-09 MED ORDER — FAMOTIDINE IN NACL 20-0.9 MG/50ML-% IV SOLN
20.0000 mg | Freq: Once | INTRAVENOUS | Status: AC
  Administered 2016-01-09: 20 mg via INTRAVENOUS

## 2016-01-09 MED ORDER — DIPHENHYDRAMINE HCL 50 MG/ML IJ SOLN
INTRAMUSCULAR | Status: AC
  Filled 2016-01-09: qty 1

## 2016-01-09 MED ORDER — SODIUM CHLORIDE 0.9 % IV SOLN
184.8000 mg | Freq: Once | INTRAVENOUS | Status: AC
  Administered 2016-01-09: 180 mg via INTRAVENOUS
  Filled 2016-01-09: qty 18

## 2016-01-09 MED ORDER — DIPHENHYDRAMINE HCL 50 MG/ML IJ SOLN
50.0000 mg | Freq: Once | INTRAMUSCULAR | Status: AC
  Administered 2016-01-09: 50 mg via INTRAVENOUS

## 2016-01-09 MED ORDER — PALONOSETRON HCL INJECTION 0.25 MG/5ML
INTRAVENOUS | Status: AC
  Filled 2016-01-09: qty 5

## 2016-01-09 MED ORDER — PALONOSETRON HCL INJECTION 0.25 MG/5ML
0.2500 mg | Freq: Once | INTRAVENOUS | Status: AC
  Administered 2016-01-09: 0.25 mg via INTRAVENOUS

## 2016-01-09 NOTE — Progress Notes (Signed)
WBC 1.8, ANC 1.2, per Dr. Julien Nordmann okay to proceed with treatment. Pt states he has no s/s of infection and reports that he has some heart burn after eating at times and that his MD is notified and it has been addressed. Dr. Julien Nordmann aware. No new orders at this time.

## 2016-01-09 NOTE — Patient Instructions (Signed)
[facility] Discharge Instructions for Patients Receiving Chemotherapy  Today you received the following chemotherapy agents Taxol and Carboplatin. To help prevent nausea and vomiting after your treatment, we encourage you to take your nausea medication as directed.  If you develop nausea and vomiting that is not controlled by your nausea medication, call the clinic.   BELOW ARE SYMPTOMS THAT SHOULD BE REPORTED IMMEDIATELY:  *FEVER GREATER THAN 100.5 F  *CHILLS WITH OR WITHOUT FEVER  NAUSEA AND VOMITING THAT IS NOT CONTROLLED WITH YOUR NAUSEA MEDICATION  *UNUSUAL SHORTNESS OF BREATH  *UNUSUAL BRUISING OR BLEEDING  TENDERNESS IN MOUTH AND THROAT WITH OR WITHOUT PRESENCE OF ULCERS  *URINARY PROBLEMS  *BOWEL PROBLEMS  UNUSUAL RASH Items with * indicate a potential emergency and should be followed up as soon as possible.  Feel free to call the clinic you have any questions or concerns. The clinic phone number is [phone].  Please show the CHEMO ALERT CARD at check-in to the Emergency Department and triage nurse.    

## 2016-01-10 ENCOUNTER — Ambulatory Visit
Admission: RE | Admit: 2016-01-10 | Discharge: 2016-01-10 | Disposition: A | Discharge status: Home / Self Care | Payer: Medicare PPO | Source: Ambulatory Visit | Attending: Radiation Oncology | Admitting: Radiation Oncology

## 2016-01-10 DIAGNOSIS — Z510 Encounter for antineoplastic radiation therapy: Secondary | ICD-10-CM | POA: Diagnosis not present

## 2016-01-11 ENCOUNTER — Encounter: Payer: Self-pay | Admitting: Radiation Oncology

## 2016-01-11 ENCOUNTER — Ambulatory Visit
Admission: RE | Admit: 2016-01-11 | Discharge: 2016-01-11 | Disposition: A | Discharge status: Home / Self Care | Payer: Medicare PPO | Source: Ambulatory Visit | Attending: Radiation Oncology | Admitting: Radiation Oncology

## 2016-01-11 VITALS — BP 113/73 | HR 94 | Temp 97.6°F | Ht 68.0 in | Wt 176.2 lb

## 2016-01-11 DIAGNOSIS — Z510 Encounter for antineoplastic radiation therapy: Secondary | ICD-10-CM | POA: Diagnosis not present

## 2016-01-11 DIAGNOSIS — C3492 Malignant neoplasm of unspecified part of left bronchus or lung: Secondary | ICD-10-CM | POA: Diagnosis present

## 2016-01-11 NOTE — Progress Notes (Signed)
Department of Radiation Oncology  Phone:  [phone] Fax:        [phone]  Weekly Treatment Note    Name: Christian Espinoza Date: 01/11/2016 MRN: [id number] DOB: 1946-04-23   Diagnosis:     ICD-9-CM ICD-10-CM   1. 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 acetaminophen (TYLENOL) 325 MG tablet Take 650 mg by mouth.    Marland Kitchen albuterol (PROVENTIL HFA;VENTOLIN HFA) 108 (90 Base) MCG/ACT inhaler Inhale into the lungs.    Marland Kitchen amLODipine (NORVASC) 5 MG tablet Take 5 mg by mouth.    . Artificial Tear Ointment (REFRESH LACRI-LUBE) OINT Place 1 application into the right eye every 4 (four) hours as needed (Right eye irritation).    . bisacodyl (DULCOLAX) 5 MG EC tablet Take 10 mg by mouth daily as needed for moderate constipation.    . gabapentin (NEURONTIN) 300 MG capsule Take 300 mg by mouth 2 (two) times daily.    . hydrOXYzine (ATARAX/VISTARIL) 50 MG tablet Take 50 mg by mouth every 6 (six) hours as needed for anxiety.    Marland Kitchen ipratropium-albuterol (DUONEB) 0.5-2.5 (3) MG/3ML SOLN Take 3 mLs by nebulization every 6 (six) hours.    Marland Kitchen LORazepam (ATIVAN) 0.5 MG tablet TK 1 T PO QD PRA  0  . Multiple Vitamins-Minerals (PRESERVISION AREDS) TABS Take 1 tablet by mouth 2 (two) times daily.    . Polyvinyl Alcohol-Povidone (REFRESH OP) Apply 1 drop to eye 2 (two) times daily.    . predniSONE (DELTASONE) 10 MG tablet Take 10 mg by mouth daily with breakfast.    . prochlorperazine (COMPAZINE) 10 MG tablet Take 1 tablet (10 mg total) by mouth every 6 (six) hours as needed for nausea or vomiting. 30 tablet 0  . risperiDONE (RISPERDAL) 2 MG tablet Take 2 mg by mouth at bedtime.     . senna (SENOKOT) 8.6 MG tablet Take 1 tablet by mouth every evening.    . sucralfate (CARAFATE) 1 g tablet Take 1 tablet (1 g total) by mouth 4 (four) times daily -  with  meals and at bedtime. 5 min before meals for radiation induced esophagitis 120 tablet 2  . traMADol (ULTRAM) 50 MG tablet Take 50 mg by mouth every 12 (twelve) hours as needed for moderate pain. Reported on 11/30/2015    . traZODone (DESYREL) 150 MG tablet Take 150 mg by mouth at bedtime.     Marland Kitchen amLODipine (NORVASC) 5 MG tablet Take 5 mg by mouth daily.     Marland Kitchen buPROPion (WELLBUTRIN SR) 150 MG 12 hr tablet Take 150 mg by mouth daily.      No current facility-administered medications for this encounter.      ALLERGIES: Review of patient's allergies indicates no known allergies.   LABORATORY DATA:  Lab Results  Component Value Date   WBC 1.8 (L) 01/09/2016   HGB 13.1 01/09/2016   HCT 39.1 01/09/2016   MCV 85.7 01/09/2016   PLT 194 01/09/2016   Lab Results  Component Value Date   NA 138 01/09/2016   K 4.0 01/09/2016   CL 103 11/10/2015   CO2 26 01/09/2016   Lab Results  Component Value Date   ALT 12 01/09/2016   AST 10 01/09/2016   ALKPHOS 94 01/09/2016   BILITOT 0.85 01/09/2016     NARRATIVE: Christian Espinoza was  seen today for weekly treatment management. The chart was checked and the patient's films were reviewed.  Christian Espinoza completes XRT today.  He states he is a "bit more SOB and denies any pain.  Appears fatigued.  Minimal erythema in the tx field.  Reports sleeping more since start of XRT   BP 113/73 (BP Location: Left Arm, Patient Position: Sitting, Cuff Size: Normal)   Pulse 94   Temp 97.6 F (36.4 C) (Oral)   Ht '5\' 8"'$  (1.727 m)   Wt 176 lb 3.2 oz (79.9 kg)   BMI 26.79 kg/m     Wt Readings from Last 3 Encounters:  01/11/16 176 lb 3.2 oz (79.9 kg)  01/05/16 177 lb 3.2 oz (80.4 kg)  01/02/16 175 lb 11.2 oz (79.7 kg)    PHYSICAL EXAMINATION: height is '5\' 8"'$  (1.727 m) and weight is 176 lb 3.2 oz (79.9 kg). His oral temperature is 97.6 F (36.4 C). His blood pressure is 113/73 and his pulse is 94.        ASSESSMENT: The patient is doing satisfactorily with  treatment. He completed his final treatment today. He did excellent over the last week.  PLAN: The patient will follow-up in our clinic in 1 month.

## 2016-01-11 NOTE — Progress Notes (Addendum)
Christian Espinoza completes XRT today.  He states he is a "bit more SOB" and denies any pain.  Appears fatigued.  Minimal erythema in the tx field on chest and upper back..  Reports sleeping more since start of XRT.  Advised to call if he has any concerns regarding respiratory status or any other changes in status and he stated agreement.   BP 113/73 (BP Location: Left Arm, Patient Position: Sitting, Cuff Size: Normal)   Pulse 94   Temp 97.6 F (36.4 C) (Oral)   Ht '5\' 8"'$  (1.727 m)   Wt 176 lb 3.2 oz (79.9 kg)   BMI 26.79 kg/m     Wt Readings from Last 3 Encounters:  01/11/16 176 lb 3.2 oz (79.9 kg)  01/05/16 177 lb 3.2 oz (80.4 kg)  01/02/16 175 lb 11.2 oz (79.7 kg)

## 2016-01-12 NOTE — Progress Notes (Signed)
  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      Indication for treatment:  Curative, Chemo-Radiotherapy       Radiation treatment dates:   11/28/2015-01/11/2016  Site/dose:   The primary tumor and involved mediastinal adenopathy were treated to 66 Gy in 33 fractions of 2 Gy.  Beams/energy:   A five field 3D conformal treatment arrangement was used delivering 6 and 10 MV photons.  Daily image-guidance CT was used to align the treatment with the targeted volume  Narrative: The patient tolerated radiation treatment relatively well.  The patient experienced increased shortness of breath.  The patient also noted sleeping more but he appeared fatigued today. The patient developed minimal erythema in the tx field.  Plan: The patient has completed radiation treatment. The patient will return to radiation oncology clinic for routine followup in one month. I advised him to call or return sooner if he has any questions or concerns related to his recovery or treatment.  ________________________________  Sheral Apley. Tammi Klippel, M.D.  This document serves as a record of services personally performed by [name], MD. It was created on his behalf by Bethann Humble, a trained medical scribe. The creation of this record is based on the scribe's personal observations and the provider's statements to them. This document has been checked and approved by the attending provider.

## 2016-01-16 ENCOUNTER — Other Ambulatory Visit (HOSPITAL_BASED_OUTPATIENT_CLINIC_OR_DEPARTMENT_OTHER): Payer: Medicare PPO

## 2016-01-16 ENCOUNTER — Ambulatory Visit (HOSPITAL_BASED_OUTPATIENT_CLINIC_OR_DEPARTMENT_OTHER): Payer: Medicare PPO | Admitting: Oncology

## 2016-01-16 ENCOUNTER — Telehealth: Payer: Self-pay | Admitting: Internal Medicine

## 2016-01-16 VITALS — BP 128/84 | HR 108 | Temp 98.3°F | Resp 16 | Wt 172.3 lb

## 2016-01-16 DIAGNOSIS — R05 Cough: Secondary | ICD-10-CM

## 2016-01-16 DIAGNOSIS — R1310 Dysphagia, unspecified: Secondary | ICD-10-CM | POA: Diagnosis not present

## 2016-01-16 DIAGNOSIS — C3432 Malignant neoplasm of lower lobe, left bronchus or lung: Secondary | ICD-10-CM

## 2016-01-16 DIAGNOSIS — C3492 Malignant neoplasm of unspecified part of left bronchus or lung: Secondary | ICD-10-CM

## 2016-01-16 LAB — COMPREHENSIVE METABOLIC PANEL
ALBUMIN: 3.4 g/dL — AB (ref 3.5–5.0)
ALK PHOS: 82 U/L (ref 40–150)
ALT: 12 U/L (ref 0–55)
AST: 10 U/L (ref 5–34)
Anion Gap: 11 mEq/L (ref 3–11)
BUN: 12.8 mg/dL (ref 7.0–26.0)
CALCIUM: 9.4 mg/dL (ref 8.4–10.4)
CO2: 29 mEq/L (ref 22–29)
CREATININE: 1.1 mg/dL (ref 0.7–1.3)
Chloride: 104 mEq/L (ref 98–109)
EGFR: 72 mL/min/{1.73_m2} — ABNORMAL LOW (ref >90)
Glucose: 109 mg/dl (ref 70–140)
POTASSIUM: 3.7 meq/L (ref 3.5–5.1)
Sodium: 144 mEq/L (ref 136–145)
Total Bilirubin: 0.41 mg/dL (ref 0.20–1.20)
Total Protein: 6.3 g/dL — ABNORMAL LOW (ref 6.4–8.3)

## 2016-01-16 LAB — CBC WITH DIFFERENTIAL/PLATELET
BASO%: 0.4 % (ref 0.0–2.0)
BASOS ABS: 0 10*3/uL (ref 0.0–0.1)
EOS%: 1 % (ref 0.0–7.0)
Eosinophils Absolute: 0 10*3/uL (ref 0.0–0.5)
HEMATOCRIT: 40.2 % (ref 38.4–49.9)
HEMOGLOBIN: 13.4 g/dL (ref 13.0–17.1)
LYMPH#: 0.5 10*3/uL — AB (ref 0.9–3.3)
LYMPH%: 13 % — ABNORMAL LOW (ref 14.0–49.0)
MCH: 28.8 pg (ref 27.2–33.4)
MCHC: 33.4 g/dL (ref 32.0–36.0)
MCV: 86.3 fL (ref 79.3–98.0)
MONO#: 0.4 10*3/uL (ref 0.1–0.9)
MONO%: 8.9 % (ref 0.0–14.0)
NEUT#: 3.1 10*3/uL (ref 1.5–6.5)
NEUT%: 76.7 % — ABNORMAL HIGH (ref 39.0–75.0)
Platelets: 212 10*3/uL (ref 140–400)
RBC: 4.66 10*6/uL (ref 4.20–5.82)
RDW: 15.4 % — AB (ref 11.0–14.6)
WBC: 4 10*3/uL (ref 4.0–10.3)

## 2016-01-16 LAB — TECHNOLOGIST REVIEW

## 2016-01-16 NOTE — Telephone Encounter (Signed)
Gave patient avs report and appointments for October. Central radiology will call re scan - patient aware.

## 2016-01-16 NOTE — Progress Notes (Signed)
[place] [phone]   [phone] Multidisciplinary thoracic oncology clinic  OFFICE PROGRESS NOTE  Pcp Not In System No address on file  DIAGNOSIS: Stage IIIB (T1b, N3, M0) non-small cell lung cancer, squamous cell carcinoma diagnosed in July 2017 presented with left lower lobe lung mass in addition to suspicious mediastinal lymphadenopathy.  PRIOR THERAPY: None  CURRENT THERAPY: Concurrent chemoradiation with weekly carboplatin for AUC of 2 and paclitaxel 45 MG/M2. First dose 11/28/2015. Status post 6 cycles  INTERVAL HISTORY: Christian Espinoza 70 y.o. male returns to the clinic today for follow-up visit. The patient completed a course of concurrent chemoradiation with weekly carboplatin and paclitaxel on 01/11/16. He has mild odynophagia and was started on treatment with Carafate by Dr. Tammi Klippel. He continues to have mild cough but no significant shortness of breath or hemoptysis. He has no significant nausea or vomiting. He has no weight loss or night sweats. He is here to follow up after completing treatment and schedule follow up scan.   MEDICAL HISTORY: Past Medical History:  Diagnosis Date  . Anxiety   . Cataract   . COPD (chronic obstructive pulmonary disease) ([place])   . Depression   . Encounter for antineoplastic chemotherapy 11/17/2015  . HTN (hypertension)   . Lung nodule    left lower lobe lung nodule, adenopathy  . Shortness of breath dyspnea    with exertion     ALLERGIES:  has No Known Allergies.  MEDICATIONS:  Current Outpatient Prescriptions  Medication Sig Dispense Refill  . acetaminophen (TYLENOL) 325 MG tablet Take 650 mg by mouth.    Marland Kitchen albuterol (PROVENTIL HFA;VENTOLIN HFA) 108 (90 Base) MCG/ACT inhaler Inhale into the lungs.    Marland Kitchen amLODipine (NORVASC) 5 MG tablet Take 5 mg by mouth.    . Artificial Tear Ointment (REFRESH LACRI-LUBE) OINT Place 1 application into the right eye every 4 (four) hours as needed (Right eye  irritation).    . gabapentin (NEURONTIN) 300 MG capsule Take 300 mg by mouth 2 (two) times daily.    . hydrOXYzine (ATARAX/VISTARIL) 50 MG tablet Take 50 mg by mouth every 6 (six) hours as needed for anxiety.    Marland Kitchen ipratropium-albuterol (DUONEB) 0.5-2.5 (3) MG/3ML SOLN Take 3 mLs by nebulization every 6 (six) hours.    Marland Kitchen LORazepam (ATIVAN) 0.5 MG tablet TK 1 T PO QD PRA  0  . Multiple Vitamins-Minerals (PRESERVISION AREDS) TABS Take 1 tablet by mouth 2 (two) times daily.    . Polyvinyl Alcohol-Povidone (REFRESH OP) Apply 1 drop to eye 2 (two) times daily.    . prochlorperazine (COMPAZINE) 10 MG tablet Take 1 tablet (10 mg total) by mouth every 6 (six) hours as needed for nausea or vomiting. 30 tablet 0  . risperiDONE (RISPERDAL) 2 MG tablet Take 2 mg by mouth at bedtime.     . senna (SENOKOT) 8.6 MG tablet Take 1 tablet by mouth every evening.    . sucralfate (CARAFATE) 1 g tablet Take 1 tablet (1 g total) by mouth 4 (four) times daily -  with meals and at bedtime. 5 min before meals for radiation induced esophagitis 120 tablet 2  . traMADol (ULTRAM) 50 MG tablet Take 50 mg by mouth every 12 (twelve) hours as needed for moderate pain. Reported on 11/30/2015    . traZODone (DESYREL) 150 MG tablet Take 150 mg by mouth at bedtime.     . bisacodyl (DULCOLAX) 5 MG EC tablet Take 10 mg by mouth  daily as needed for moderate constipation.    . predniSONE (DELTASONE) 10 MG tablet Take 10 mg by mouth daily with breakfast.     No current facility-administered medications for this visit.     SURGICAL HISTORY:  Past Surgical History:  Procedure Laterality Date  . APPENDECTOMY    . EYE SURGERY     cataracts removed  . VIDEO BRONCHOSCOPY WITH ENDOBRONCHIAL NAVIGATION N/A 11/10/2015   Procedure: VIDEO BRONCHOSCOPY WITH ENDOBRONCHIAL NAVIGATION;  Surgeon: Melrose Nakayama, MD;  Location: [place];  Service: Thoracic;  Laterality: N/A;  . VIDEO BRONCHOSCOPY WITH ENDOBRONCHIAL ULTRASOUND N/A 11/10/2015    Procedure: VIDEO BRONCHOSCOPY WITH ENDOBRONCHIAL ULTRASOUND;  Surgeon: Melrose Nakayama, MD;  Location: [place];  Service: Thoracic;  Laterality: N/A;    REVIEW OF SYSTEMS:  A comprehensive review of systems was negative except for: Constitutional: positive for fatigue Respiratory: positive for cough and dyspnea on exertion Gastrointestinal: positive for odynophagia   PHYSICAL EXAMINATION: General appearance: alert, cooperative and no distress Head: Normocephalic, without obvious abnormality, atraumatic Neck: no adenopathy, no JVD, supple, symmetrical, trachea midline and thyroid not enlarged, symmetric, no tenderness/mass/nodules Lymph nodes: Cervical, supraclavicular, and axillary nodes normal. Resp: clear to auscultation bilaterally Back: symmetric, no curvature. ROM normal. No CVA tenderness. Cardio: regular rate and rhythm, S1, S2 normal, no murmur, click, rub or gallop GI: soft, non-tender; bowel sounds normal; no masses,  no organomegaly Extremities: extremities normal, atraumatic, no cyanosis or edema Neurologic: Alert and oriented X 3, normal strength and tone. Normal symmetric reflexes. Normal coordination and gait  ECOG PERFORMANCE STATUS: 1 - Symptomatic but completely ambulatory  Blood pressure 128/84, pulse (!) 108, temperature 98.3 F (36.8 C), temperature source Oral, resp. rate 16, weight 172 lb 5 oz (78.2 kg), SpO2 94 %.  LABORATORY DATA: Lab Results  Component Value Date   WBC 4.0 01/16/2016   HGB 13.4 01/16/2016   HCT 40.2 01/16/2016   MCV 86.3 01/16/2016   PLT 212 01/16/2016      Chemistry      Component Value Date/Time   NA 144 01/16/2016 1007   K 3.7 01/16/2016 1007   CL 103 11/10/2015 0646   CO2 29 01/16/2016 1007   BUN 12.8 01/16/2016 1007   CREATININE 1.1 01/16/2016 1007      Component Value Date/Time   CALCIUM 9.4 01/16/2016 1007   ALKPHOS 82 01/16/2016 1007   AST 10 01/16/2016 1007   ALT 12 01/16/2016 1007   BILITOT 0.41 01/16/2016 1007        RADIOGRAPHIC STUDIES: No results found.  ASSESSMENT AND PLAN: This is a very pleasant 70 year old white male with questionably stage IIIB non-small cell lung cancer, squamous cell carcinoma presenting with left lower lobe lung mass in addition to suspicious mediastinal lymphadenopathy. pending tissue diagnosis. The patient recently completed a course of concurrent chemoradiation with weekly carboplatin and paclitaxel and tolerated his treatment well CBC reviewed and is stable.  He will have a restaging CT scan in about 6 weeks. He was advised to call immediately if he has any concerning symptoms in the interval. The patient voices understanding of current disease status and treatment options and is in agreement with the current care plan.  All questions were answered. The patient knows to call the clinic with any problems, questions or concerns. We can certainly see the patient much sooner if necessary.  Patient reviewed with Dr. Julien Nordmann.

## 2016-02-21 ENCOUNTER — Ambulatory Visit
Admission: RE | Admit: 2016-02-21 | Discharge: 2016-02-21 | Disposition: A | Discharge status: Home / Self Care | Payer: Medicare PPO | Source: Ambulatory Visit | Attending: Radiation Oncology | Admitting: Radiation Oncology

## 2016-02-21 ENCOUNTER — Institutional Professional Consult (permissible substitution): Payer: Medicare PPO | Admitting: Pulmonary Disease

## 2016-02-21 ENCOUNTER — Encounter: Payer: Self-pay | Admitting: Radiation Oncology

## 2016-02-21 ENCOUNTER — Telehealth: Payer: Self-pay | Admitting: *Deleted

## 2016-02-21 VITALS — BP 118/91 | HR 90 | Temp 98.4°F | Resp 22 | Ht 68.0 in | Wt 177.6 lb

## 2016-02-21 DIAGNOSIS — C3432 Malignant neoplasm of lower lobe, left bronchus or lung: Secondary | ICD-10-CM | POA: Diagnosis not present

## 2016-02-21 DIAGNOSIS — Z923 Personal history of irradiation: Secondary | ICD-10-CM | POA: Diagnosis not present

## 2016-02-21 DIAGNOSIS — R590 Localized enlarged lymph nodes: Secondary | ICD-10-CM | POA: Insufficient documentation

## 2016-02-21 DIAGNOSIS — C3492 Malignant neoplasm of unspecified part of left bronchus or lung: Secondary | ICD-10-CM

## 2016-02-21 DIAGNOSIS — R062 Wheezing: Secondary | ICD-10-CM

## 2016-02-21 DIAGNOSIS — J449 Chronic obstructive pulmonary disease, unspecified: Secondary | ICD-10-CM | POA: Insufficient documentation

## 2016-02-21 NOTE — Telephone Encounter (Signed)
CALLED [facility] TO INFORM OF APPT. WITH DR. Melvyn Novas ON 03-08-16- ARRIVAL TIME - 9:15 AM - ADDRESS- [address] SECOND FLOOR, [address] -[phone], SPOKE WITH [facility] AND THEY ARE AWARE OF THIS APPT.

## 2016-02-21 NOTE — Progress Notes (Signed)
Mr. [place] here for reassessment S/P XRT to his left lower lobe.  Upon ambulation from waiting area, noted O2 sat of 80% and once sitting and resting O2 Sat 92%.  He has a nebulizer which is used twice daily.  No oxygen.  He denies any pain today. No other voiced concerns.   BP (!) 118/91   Pulse 90   Temp 98.4 F (36.9 C) (Oral)   Resp (!) 22 Comment: upon ambulation  Ht '5\' 8"'$  (1.727 m)   Wt 177 lb 9.6 oz (80.6 kg)   SpO2 97% Comment: after resting.  BMI 27.00 kg/m    Wt Readings from Last 3 Encounters:  02/21/16 177 lb 9.6 oz (80.6 kg)  01/16/16 172 lb 5 oz (78.2 kg)  01/11/16 176 lb 3.2 oz (79.9 kg)

## 2016-02-21 NOTE — Progress Notes (Signed)
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       Interval Since Last Radiation:  6 weeks   11/28/2015-01/11/2016: The primary tumor and involved mediastinal adenopathy were treated to 66 Gy in 33 fractions of 2 Gy.   Narrative:  The patient returns today for routine follow-up. He has completed taxol/carboplatin with Dr. Julien Nordmann, and he tolerated radiotherapy very well. During the course of treatment he had mild fatigue and mild erythema in the treatment field.                              On review of systems, the patient states he is doing very well. He denies any difficulty with dysphagia, skin irritation, nausea or vomiting. He does have some wheezing at times and he has made some changes with his primary provider in his nebulizer. There is added a steroid component to this. He is now taking this twice a day. He denies any acute onset of shortness of breath but does become somewhat winded her report when walking. He has never used any oxygen at home.  ALLERGIES:  has No Known Allergies.  Meds: Current Outpatient Prescriptions  Medication Sig Dispense Refill  . acetaminophen (TYLENOL) 325 MG tablet Take 650 mg by mouth.    Marland Kitchen albuterol (PROVENTIL HFA;VENTOLIN HFA) 108 (90 Base) MCG/ACT inhaler Inhale into the lungs.    Marland Kitchen amLODipine (NORVASC) 5 MG tablet Take 5 mg by mouth.    . Artificial Tear Ointment (REFRESH LACRI-LUBE) OINT Place 1 application into the right eye every 4 (four) hours as needed (Right eye irritation).    . bisacodyl (DULCOLAX) 5 MG EC tablet Take 10 mg by mouth daily as needed for moderate constipation.    . gabapentin (NEURONTIN) 300 MG capsule Take 300 mg  by mouth 2 (two) times daily.    . hydrOXYzine (ATARAX/VISTARIL) 50 MG tablet Take 50 mg by mouth every 6 (six) hours as needed for anxiety.    Marland Kitchen ipratropium-albuterol (DUONEB) 0.5-2.5 (3) MG/3ML SOLN Take 3 mLs by nebulization every 6 (six) hours.    Marland Kitchen LORazepam (ATIVAN) 0.5 MG tablet TK 1 T PO QD PRA  0  . Multiple Vitamins-Minerals (PRESERVISION AREDS) TABS Take 1 tablet by mouth 2 (two) times daily.    . Polyvinyl Alcohol-Povidone (REFRESH OP) Apply 1 drop to eye 2 (two) times daily.    . predniSONE (DELTASONE) 10 MG tablet Take 10 mg by mouth daily with breakfast.    . prochlorperazine (COMPAZINE) 10 MG tablet Take 1 tablet (10 mg total) by mouth every 6 (six) hours as needed for nausea or vomiting. 30 tablet 0  . risperiDONE (RISPERDAL) 2 MG tablet Take 2 mg by mouth at bedtime.     . senna (SENOKOT) 8.6 MG tablet Take 1 tablet by mouth every evening.    . sucralfate (CARAFATE) 1 g tablet Take 1 tablet (1 g total) by mouth 4 (four) times daily -  with meals and at bedtime. 5 min before meals for radiation induced esophagitis 120 tablet 2  . traMADol (ULTRAM) 50 MG tablet Take 50 mg by  mouth every 12 (twelve) hours as needed for moderate pain. Reported on 11/30/2015    . traZODone (DESYREL) 150 MG tablet Take 150 mg by mouth at bedtime.      No current facility-administered medications for this encounter.     Physical Findings:  height is '5\' 8"'$  (1.727 m) and weight is 177 lb 9.6 oz (80.6 kg). His oral temperature is 98.4 F (36.9 C). His blood pressure is 118/91 (abnormal) and his pulse is 90. His respiration is 22 (abnormal) and oxygen saturation is 97%.  In general this is a well appearing caucasian male in no acute distress. He is alert and oriented x4 and appropriate throughout the examination. HEENT reveals that the patient is normocephalic, atraumatic. EOMs are intact. PERRLA. Skin is intact without any evidence of gross lesions, desquamation is not present, but there is  hyperpigmentation on the posterior thorax consistent with prior treatment. Cardiovascular exam reveals a regular rate and rhythm, no clicks rubs or murmurs are auscultated. Chest reveals wheezes in the right apex, but the remainder of his chest is clear to auscultation bilaterally.    Lab Findings: Lab Results  Component Value Date   WBC 4.0 01/16/2016   HGB 13.4 01/16/2016   HCT 40.2 01/16/2016   MCV 86.3 01/16/2016   PLT 212 01/16/2016     Radiographic Findings: No results found.  Impression/Plan: 31. 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 The patient completed his course of systemic therapy with Dr. Julien Nordmann. He will now move into a mode of surveillance provided that his posttreatment scan has shown improvement. We would be happy to see the patient back as needed moving forward, and he is encouraged to call if he has any questions or concerns regarding his previous treatment.    2. COPD. The patient reports that the facility provider is managing his COPD. He did have an O2 level after walking down the hall of 80%, and subsequently after resting came back up to 92%, and 97%. On examination he does have some wheezing, and I have discussed a referral to pulmonary medicine for them to weigh in on his current medication strategy as well as determining if he needs any supplemental O2. He is in agreement.       Carola Rhine, PAC

## 2016-02-27 ENCOUNTER — Encounter (HOSPITAL_COMMUNITY): Payer: Self-pay

## 2016-02-27 ENCOUNTER — Other Ambulatory Visit (HOSPITAL_BASED_OUTPATIENT_CLINIC_OR_DEPARTMENT_OTHER): Payer: Medicare PPO

## 2016-02-27 ENCOUNTER — Ambulatory Visit (HOSPITAL_COMMUNITY)
Admission: RE | Admit: 2016-02-27 | Discharge: 2016-02-27 | Disposition: A | Discharge status: Home / Self Care | Payer: Medicare PPO | Source: Ambulatory Visit | Attending: Oncology | Admitting: Oncology

## 2016-02-27 DIAGNOSIS — Z9221 Personal history of antineoplastic chemotherapy: Secondary | ICD-10-CM | POA: Insufficient documentation

## 2016-02-27 DIAGNOSIS — C3492 Malignant neoplasm of unspecified part of left bronchus or lung: Secondary | ICD-10-CM

## 2016-02-27 DIAGNOSIS — C3432 Malignant neoplasm of lower lobe, left bronchus or lung: Secondary | ICD-10-CM

## 2016-02-27 DIAGNOSIS — R918 Other nonspecific abnormal finding of lung field: Secondary | ICD-10-CM | POA: Insufficient documentation

## 2016-02-27 DIAGNOSIS — J439 Emphysema, unspecified: Secondary | ICD-10-CM | POA: Insufficient documentation

## 2016-02-27 DIAGNOSIS — Z923 Personal history of irradiation: Secondary | ICD-10-CM | POA: Diagnosis not present

## 2016-02-27 DIAGNOSIS — I700 Atherosclerosis of aorta: Secondary | ICD-10-CM | POA: Diagnosis not present

## 2016-02-27 LAB — CBC WITH DIFFERENTIAL/PLATELET
BASO%: 0.8 % (ref 0.0–2.0)
Basophils Absolute: 0.1 10*3/uL (ref 0.0–0.1)
EOS ABS: 0.2 10*3/uL (ref 0.0–0.5)
EOS%: 3.5 % (ref 0.0–7.0)
HEMATOCRIT: 42.9 % (ref 38.4–49.9)
HEMOGLOBIN: 14.4 g/dL (ref 13.0–17.1)
LYMPH#: 0.9 10*3/uL (ref 0.9–3.3)
LYMPH%: 14.1 % (ref 14.0–49.0)
MCH: 30.7 pg (ref 27.2–33.4)
MCHC: 33.5 g/dL (ref 32.0–36.0)
MCV: 91.5 fL (ref 79.3–98.0)
MONO#: 0.5 10*3/uL (ref 0.1–0.9)
MONO%: 8.2 % (ref 0.0–14.0)
NEUT%: 73.4 % (ref 39.0–75.0)
NEUTROS ABS: 4.8 10*3/uL (ref 1.5–6.5)
PLATELETS: 159 10*3/uL (ref 140–400)
RBC: 4.69 10*6/uL (ref 4.20–5.82)
RDW: 17.8 % — ABNORMAL HIGH (ref 11.0–14.6)
WBC: 6.6 10*3/uL (ref 4.0–10.3)

## 2016-02-27 LAB — COMPREHENSIVE METABOLIC PANEL
ALBUMIN: 3.7 g/dL (ref 3.5–5.0)
ALK PHOS: 80 U/L (ref 40–150)
ALT: 16 U/L (ref 0–55)
ANION GAP: 9 meq/L (ref 3–11)
AST: 13 U/L (ref 5–34)
BILIRUBIN TOTAL: 0.38 mg/dL (ref 0.20–1.20)
BUN: 8.4 mg/dL (ref 7.0–26.0)
CALCIUM: 9.1 mg/dL (ref 8.4–10.4)
CO2: 27 meq/L (ref 22–29)
CREATININE: 1.2 mg/dL (ref 0.7–1.3)
Chloride: 103 mEq/L (ref 98–109)
EGFR: 62 mL/min/{1.73_m2} — AB (ref >90)
Glucose: 86 mg/dl (ref 70–140)
Potassium: 4.1 mEq/L (ref 3.5–5.1)
Sodium: 140 mEq/L (ref 136–145)
TOTAL PROTEIN: 6.3 g/dL — AB (ref 6.4–8.3)

## 2016-02-27 MED ORDER — IOPAMIDOL (ISOVUE-300) INJECTION 61%
75.0000 mL | Freq: Once | INTRAVENOUS | Status: AC | PRN
  Administered 2016-02-27: 75 mL via INTRAVENOUS

## 2016-02-28 ENCOUNTER — Telehealth: Payer: Self-pay | Admitting: Internal Medicine

## 2016-02-28 NOTE — Telephone Encounter (Signed)
MM PAL - MOVED 10/12 F/U TO 10/11. SPOKE WITH CRYSTAL AT [facility] CARE RE CHANGE AND NEW DATE/TIME.

## 2016-02-29 ENCOUNTER — Encounter: Payer: Self-pay | Admitting: Internal Medicine

## 2016-02-29 ENCOUNTER — Ambulatory Visit (HOSPITAL_BASED_OUTPATIENT_CLINIC_OR_DEPARTMENT_OTHER): Payer: Medicare PPO | Admitting: Internal Medicine

## 2016-02-29 VITALS — BP 128/77 | HR 92 | Temp 97.7°F | Resp 17 | Ht 68.0 in | Wt 177.5 lb

## 2016-02-29 DIAGNOSIS — C3492 Malignant neoplasm of unspecified part of left bronchus or lung: Secondary | ICD-10-CM

## 2016-02-29 DIAGNOSIS — Z5111 Encounter for antineoplastic chemotherapy: Secondary | ICD-10-CM

## 2016-02-29 DIAGNOSIS — C3432 Malignant neoplasm of lower lobe, left bronchus or lung: Secondary | ICD-10-CM | POA: Diagnosis not present

## 2016-02-29 NOTE — Progress Notes (Signed)
START ON PATHWAY REGIMEN - Non-Small Cell Lung  LOS23: Carboplatin AUC=6 + Paclitaxel 200 mg/m2 q21 Days x 2-3 Cycles   A cycle is every 21 days:     Paclitaxel (Taxol(R)) 200 mg/m2 in 500 mL NS IV over 3 hours followed by Dose Mod: None     Carboplatin (Paraplatin(R)) AUC=6 in 250 mL NS IV over 1 hour Dose Mod: None Additional Orders: * All AUC calculations intended to be used in [employer] formula  **Always confirm dose/schedule in your pharmacy ordering system**    Patient Characteristics: Stage III - Unresectable, PS = 0, 1 AJCC M Stage: 0 AJCC N Stage: 3 AJCC T Stage: 1b Current Disease Status: No Distant Metastases or Local Recurrence AJCC Stage Grouping: IIIB Performance Status: PS = 0, 1  Intent of Therapy: Non-Curative / Palliative Intent, Discussed with Patient

## 2016-02-29 NOTE — Progress Notes (Signed)
[facility] [phone]   [phone] Multidisciplinary thoracic oncology clinic  OFFICE PROGRESS NOTE  Pcp Not In System No address on file  DIAGNOSIS: Stage IIIB (T1b, N3, M0) non-small cell lung cancer, squamous cell carcinoma diagnosed in July 2017 presented with left lower lobe lung mass in addition to suspicious mediastinal lymphadenopathy.  PRIOR THERAPY: Concurrent chemoradiation with weekly carboplatin for AUC of 2 and paclitaxel 45 MG/M2. First dose 11/28/2015. Status post 6 cycles, last was given 01/09/2016.  CURRENT THERAPY: Consolidation chemotherapy with carboplatin for AUC of 5 and paclitaxel 175 MG/M2 every 3 weeks with Neulasta support. First dose 03/07/2016.   INTERVAL HISTORY: Christian Espinoza 70 y.o. male returns to the clinic today for follow-up visit accompanied by his caregiver. The patient completed a course of concurrent chemoradiation with weekly carboplatin and paclitaxel status post 6 cycles and he tolerated his treatment well. He denied having any significant chest pain, shortness breath, cough or hemoptysis. He has no significant nausea or vomiting. He has no weight loss or night sweats. He had repeat CT scan of the chest performed recently and he is here for evaluation and discussion of his scan results.  MEDICAL HISTORY: Past Medical History:  Diagnosis Date  . Anxiety   . Cataract   . COPD (chronic obstructive pulmonary disease) ([place])   . Depression   . Encounter for antineoplastic chemotherapy 11/17/2015  . HTN (hypertension)   . Lung nodule    left lower lobe lung nodule, adenopathy  . Shortness of breath dyspnea    with exertion     ALLERGIES:  has No Known Allergies.  MEDICATIONS:  Current Outpatient Prescriptions  Medication Sig Dispense Refill  . acetaminophen (TYLENOL) 325 MG tablet Take 650 mg by mouth.    Marland Kitchen albuterol (PROVENTIL HFA;VENTOLIN HFA) 108 (90 Base) MCG/ACT inhaler Inhale into the lungs.    Marland Kitchen  amLODipine (NORVASC) 5 MG tablet Take 5 mg by mouth.    . Artificial Tear Ointment (REFRESH LACRI-LUBE) OINT Place 1 application into the right eye every 4 (four) hours as needed (Right eye irritation).    . bisacodyl (DULCOLAX) 5 MG EC tablet Take 10 mg by mouth daily as needed for moderate constipation.    . gabapentin (NEURONTIN) 300 MG capsule Take 300 mg by mouth 2 (two) times daily.    . hydrOXYzine (ATARAX/VISTARIL) 50 MG tablet Take 50 mg by mouth every 6 (six) hours as needed for anxiety.    Marland Kitchen ipratropium-albuterol (DUONEB) 0.5-2.5 (3) MG/3ML SOLN Take 3 mLs by nebulization every 6 (six) hours.    Marland Kitchen LORazepam (ATIVAN) 0.5 MG tablet TK 1 T PO QD PRA  0  . Multiple Vitamins-Minerals (PRESERVISION AREDS) TABS Take 1 tablet by mouth 2 (two) times daily.    . Polyvinyl Alcohol-Povidone (REFRESH OP) Apply 1 drop to eye 2 (two) times daily.    . predniSONE (DELTASONE) 10 MG tablet Take 10 mg by mouth daily with breakfast.    . prochlorperazine (COMPAZINE) 10 MG tablet Take 1 tablet (10 mg total) by mouth every 6 (six) hours as needed for nausea or vomiting. 30 tablet 0  . risperiDONE (RISPERDAL) 2 MG tablet Take 2 mg by mouth at bedtime.     . senna (SENOKOT) 8.6 MG tablet Take 1 tablet by mouth every evening.    . sucralfate (CARAFATE) 1 g tablet Take 1 tablet (1 g total) by mouth 4 (four) times daily -  with meals and at bedtime. 5  min before meals for radiation induced esophagitis 120 tablet 2  . traMADol (ULTRAM) 50 MG tablet Take 50 mg by mouth every 12 (twelve) hours as needed for moderate pain. Reported on 11/30/2015    . traZODone (DESYREL) 150 MG tablet Take 150 mg by mouth at bedtime.      No current facility-administered medications for this visit.     SURGICAL HISTORY:  Past Surgical History:  Procedure Laterality Date  . APPENDECTOMY    . EYE SURGERY     cataracts removed  . VIDEO BRONCHOSCOPY WITH ENDOBRONCHIAL NAVIGATION N/A 11/10/2015   Procedure: VIDEO BRONCHOSCOPY WITH  ENDOBRONCHIAL NAVIGATION;  Surgeon: Melrose Nakayama, MD;  Location: [facility];  Service: Thoracic;  Laterality: N/A;  . VIDEO BRONCHOSCOPY WITH ENDOBRONCHIAL ULTRASOUND N/A 11/10/2015   Procedure: VIDEO BRONCHOSCOPY WITH ENDOBRONCHIAL ULTRASOUND;  Surgeon: Melrose Nakayama, MD;  Location: [facility];  Service: Thoracic;  Laterality: N/A;    REVIEW OF SYSTEMS:  Constitutional: negative Eyes: negative Ears, nose, mouth, throat, and face: negative Respiratory: positive for cough Cardiovascular: negative Gastrointestinal: negative Genitourinary:negative Integument/breast: negative Hematologic/lymphatic: negative Musculoskeletal:negative Neurological: negative Behavioral/Psych: negative Endocrine: negative Allergic/Immunologic: negative   PHYSICAL EXAMINATION: General appearance: alert, cooperative and no distress Head: Normocephalic, without obvious abnormality, atraumatic Neck: no adenopathy, no JVD, supple, symmetrical, trachea midline and thyroid not enlarged, symmetric, no tenderness/mass/nodules Lymph nodes: Cervical, supraclavicular, and axillary nodes normal. Resp: clear to auscultation bilaterally Back: symmetric, no curvature. ROM normal. No CVA tenderness. Cardio: regular rate and rhythm, S1, S2 normal, no murmur, click, rub or gallop GI: soft, non-tender; bowel sounds normal; no masses,  no organomegaly Extremities: extremities normal, atraumatic, no cyanosis or edema Neurologic: Alert and oriented X 3, normal strength and tone. Normal symmetric reflexes. Normal coordination and gait  ECOG PERFORMANCE STATUS: 1 - Symptomatic but completely ambulatory  Blood pressure 128/77, pulse 92, temperature 97.7 F (36.5 C), temperature source Oral, resp. rate 17, height '5\' 8"'$  (1.727 m), weight 177 lb 8 oz (80.5 kg), SpO2 94 %.  LABORATORY DATA: Lab Results  Component Value Date   WBC 6.6 02/27/2016   HGB 14.4 02/27/2016   HCT 42.9 02/27/2016   MCV 91.5 02/27/2016   PLT 159  02/27/2016      Chemistry      Component Value Date/Time   NA 140 02/27/2016 0945   K 4.1 02/27/2016 0945   CL 103 11/10/2015 0646   CO2 27 02/27/2016 0945   BUN 8.4 02/27/2016 0945   CREATININE 1.2 02/27/2016 0945      Component Value Date/Time   CALCIUM 9.1 02/27/2016 0945   ALKPHOS 80 02/27/2016 0945   AST 13 02/27/2016 0945   ALT 16 02/27/2016 0945   BILITOT 0.38 02/27/2016 0945       RADIOGRAPHIC STUDIES: Ct Chest W Contrast  Result Date: 02/27/2016 CLINICAL DATA:  Lung cancer, chemotherapy and radiation therapy complete. EXAM: CT CHEST WITH CONTRAST TECHNIQUE: Multidetector CT imaging of the chest was performed during intravenous contrast administration. CONTRAST:  72m ISOVUE-300 IOPAMIDOL (ISOVUE-300) INJECTION 61% COMPARISON:  10/25/2015. FINDINGS: Cardiovascular: Atherosclerotic calcification of the arterial vasculature, including three-vessel involvement of the coronary arteries. Heart size normal. No pericardial effusion. Mediastinum/Nodes: No pathologically enlarged mediastinal, hilar or axillary lymph nodes. Esophagus is grossly unremarkable. Lungs/Pleura: Severe centrilobular emphysema. Spiculated mass along the left major fissure measures 1.9 x 3.0 cm, previously 2.5 x 3.5 cm. Triangular-shaped 5 mm nodule along the minor fissure, stable and likely a subpleural lymph node. Calcified granuloma in the right lower  lobe. Subpleural ground-glass and reticulation in the right hemi thorax, unchanged and nonspecific. No pleural fluid. Airway is unremarkable. Upper Abdomen: Sub cm low-attenuation lesion in the left hepatic lobe is too small to characterize. Visualized portions of the liver, gallbladder, adrenal glands, kidneys, spleen, pancreas, stomach and bowel are otherwise grossly unremarkable. No upper abdominal adenopathy. Musculoskeletal: No worrisome lytic or sclerotic lesions. IMPRESSION: 1. Spiculated mass along the left major fissure has decreased in size slightly in the  interval. 2. 5 mm triangular-shaped along the minor fissure, unchanged, likely a subpleural lymph node. 3. Aortic atherosclerosis (ICD10-170.0). Three-vessel coronary artery calcification. 4.  Emphysema (ICD10-J43.9). Electronically Signed   By: Lorin Picket M.D.   On: 02/27/2016 14:10    ASSESSMENT AND PLAN: This is a very pleasant 70 years old white male with questionably stage IIIB non-small cell lung cancer, squamous cell carcinoma presenting with left lower lobe lung mass in addition to suspicious mediastinal lymphadenopathy. pending tissue diagnosis. The patient completed a course of concurrent chemoradiation with weekly carboplatin and paclitaxel, status post 6 cycles and is tolerating his treatment well. The recent CT scan of the chest showed improvement of his disease. I discussed the scan results with the patient and his caregiver. I give him the option of continuous observation could monitoring versus consideration of 3 cycles of consolidation chemotherapy was carboplatin for AUC of 5 and paclitaxel 175 MG/M2 every 3 weeks with Neulasta support. He is interested in proceeding with the consolidation chemotherapy. I discussed with him the adverse effect of this treatment including but not limited to alopecia, myelosuppression, nausea and vomiting, peripheral neuropathy, liver or renal dysfunction. He is expected to start the first cycle of this treatment on 03/07/2016. The patient would come back for follow-up visit in 4 weeks for evaluation before starting cycle #2. He was advised to call immediately if he has any concerning symptoms in the interval. The patient voices understanding of current disease status and treatment options and is in agreement with the current care plan.  All questions were answered. The patient knows to call the clinic with any problems, questions or concerns. We can certainly see the patient much sooner if necessary.  Disclaimer: This note was dictated with voice  recognition software. Similar sounding words can inadvertently be transcribed and may not be corrected upon review.

## 2016-03-01 ENCOUNTER — Ambulatory Visit: Payer: Medicare PPO | Admitting: Internal Medicine

## 2016-03-06 ENCOUNTER — Telehealth: Payer: Self-pay | Admitting: *Deleted

## 2016-03-06 NOTE — Telephone Encounter (Signed)
Patients caregiver called and I scheduled app ts for tomorrow. She is aware of the time. She will pick up calender tomorrow. She advised that the transportation at the nursing home only runs from 8/3. Per LOS I have scheduled app ts and patient will pick up calendar tomorrow.

## 2016-03-07 ENCOUNTER — Ambulatory Visit (HOSPITAL_BASED_OUTPATIENT_CLINIC_OR_DEPARTMENT_OTHER): Payer: Medicare PPO

## 2016-03-07 ENCOUNTER — Other Ambulatory Visit (HOSPITAL_BASED_OUTPATIENT_CLINIC_OR_DEPARTMENT_OTHER): Payer: Medicare PPO

## 2016-03-07 VITALS — BP 112/88 | HR 78 | Temp 98.5°F | Resp 20

## 2016-03-07 DIAGNOSIS — Z5111 Encounter for antineoplastic chemotherapy: Secondary | ICD-10-CM

## 2016-03-07 DIAGNOSIS — C3492 Malignant neoplasm of unspecified part of left bronchus or lung: Secondary | ICD-10-CM

## 2016-03-07 DIAGNOSIS — C3432 Malignant neoplasm of lower lobe, left bronchus or lung: Secondary | ICD-10-CM

## 2016-03-07 LAB — COMPREHENSIVE METABOLIC PANEL
ALT: 12 U/L (ref 0–55)
ANION GAP: 8 meq/L (ref 3–11)
AST: 11 U/L (ref 5–34)
Albumin: 3.5 g/dL (ref 3.5–5.0)
Alkaline Phosphatase: 77 U/L (ref 40–150)
BUN: 11.7 mg/dL (ref 7.0–26.0)
CHLORIDE: 105 meq/L (ref 98–109)
CO2: 27 meq/L (ref 22–29)
Calcium: 9.1 mg/dL (ref 8.4–10.4)
Creatinine: 1.1 mg/dL (ref 0.7–1.3)
EGFR: 71 mL/min/{1.73_m2} — AB (ref >90)
Glucose: 76 mg/dl (ref 70–140)
Potassium: 3.8 mEq/L (ref 3.5–5.1)
SODIUM: 140 meq/L (ref 136–145)
Total Bilirubin: 0.51 mg/dL (ref 0.20–1.20)
Total Protein: 6.1 g/dL — ABNORMAL LOW (ref 6.4–8.3)

## 2016-03-07 LAB — CBC WITH DIFFERENTIAL/PLATELET
BASO%: 0.5 % (ref 0.0–2.0)
Basophils Absolute: 0 10*3/uL (ref 0.0–0.1)
EOS ABS: 0.2 10*3/uL (ref 0.0–0.5)
EOS%: 3.2 % (ref 0.0–7.0)
HCT: 42.2 % (ref 38.4–49.9)
HGB: 14.5 g/dL (ref 13.0–17.1)
LYMPH%: 14 % (ref 14.0–49.0)
MCH: 31.2 pg (ref 27.2–33.4)
MCHC: 34.4 g/dL (ref 32.0–36.0)
MCV: 90.8 fL (ref 79.3–98.0)
MONO#: 0.6 10*3/uL (ref 0.1–0.9)
MONO%: 10.1 % (ref 0.0–14.0)
NEUT#: 4.5 10*3/uL (ref 1.5–6.5)
NEUT%: 72.2 % (ref 39.0–75.0)
PLATELETS: 135 10*3/uL — AB (ref 140–400)
RBC: 4.65 10*6/uL (ref 4.20–5.82)
RDW: 15 % — ABNORMAL HIGH (ref 11.0–14.6)
WBC: 6.2 10*3/uL (ref 4.0–10.3)
lymph#: 0.9 10*3/uL (ref 0.9–3.3)

## 2016-03-07 MED ORDER — PALONOSETRON HCL INJECTION 0.25 MG/5ML
0.2500 mg | Freq: Once | INTRAVENOUS | Status: AC
Start: 2016-03-07 — End: 2016-03-07
  Administered 2016-03-07: 0.25 mg via INTRAVENOUS

## 2016-03-07 MED ORDER — FAMOTIDINE IN NACL 20-0.9 MG/50ML-% IV SOLN
INTRAVENOUS | Status: AC
  Filled 2016-03-07: qty 50

## 2016-03-07 MED ORDER — DIPHENHYDRAMINE HCL 50 MG/ML IJ SOLN
INTRAMUSCULAR | Status: AC
  Filled 2016-03-07: qty 1

## 2016-03-07 MED ORDER — SODIUM CHLORIDE 0.9 % IV SOLN
175.0000 mg/m2 | Freq: Once | INTRAVENOUS | Status: AC
  Administered 2016-03-07: 342 mg via INTRAVENOUS
  Filled 2016-03-07: qty 57

## 2016-03-07 MED ORDER — SODIUM CHLORIDE 0.9 % IV SOLN
Freq: Once | INTRAVENOUS | Status: AC
  Administered 2016-03-07: 10:00:00 via INTRAVENOUS

## 2016-03-07 MED ORDER — SODIUM CHLORIDE 0.9 % IV SOLN
456.0000 mg | Freq: Once | INTRAVENOUS | Status: AC
  Administered 2016-03-07: 460 mg via INTRAVENOUS
  Filled 2016-03-07: qty 46

## 2016-03-07 MED ORDER — DIPHENHYDRAMINE HCL 50 MG/ML IJ SOLN
50.0000 mg | Freq: Once | INTRAMUSCULAR | Status: AC
  Administered 2016-03-07: 50 mg via INTRAVENOUS

## 2016-03-07 MED ORDER — PALONOSETRON HCL INJECTION 0.25 MG/5ML
INTRAVENOUS | Status: AC
  Filled 2016-03-07: qty 5

## 2016-03-07 MED ORDER — FAMOTIDINE IN NACL 20-0.9 MG/50ML-% IV SOLN
20.0000 mg | Freq: Once | INTRAVENOUS | Status: AC
  Administered 2016-03-07: 20 mg via INTRAVENOUS

## 2016-03-07 MED ORDER — DEXAMETHASONE SODIUM PHOSPHATE 100 MG/10ML IJ SOLN
20.0000 mg | Freq: Once | INTRAMUSCULAR | Status: AC
  Administered 2016-03-07: 20 mg via INTRAVENOUS
  Filled 2016-03-07: qty 2

## 2016-03-07 NOTE — Patient Instructions (Signed)
[facility] Discharge Instructions for Patients Receiving Chemotherapy  Today you received the following chemotherapy agents Taxol and Carboplatin. To help prevent nausea and vomiting after your treatment, we encourage you to take your nausea medication as directed.  If you develop nausea and vomiting that is not controlled by your nausea medication, call the clinic.   BELOW ARE SYMPTOMS THAT SHOULD BE REPORTED IMMEDIATELY:  *FEVER GREATER THAN 100.5 F  *CHILLS WITH OR WITHOUT FEVER  NAUSEA AND VOMITING THAT IS NOT CONTROLLED WITH YOUR NAUSEA MEDICATION  *UNUSUAL SHORTNESS OF BREATH  *UNUSUAL BRUISING OR BLEEDING  TENDERNESS IN MOUTH AND THROAT WITH OR WITHOUT PRESENCE OF ULCERS  *URINARY PROBLEMS  *BOWEL PROBLEMS  UNUSUAL RASH Items with * indicate a potential emergency and should be followed up as soon as possible.  Feel free to call the clinic you have any questions or concerns. The clinic phone number is [phone].  Please show the CHEMO ALERT CARD at check-in to the Emergency Department and triage nurse.    

## 2016-03-08 ENCOUNTER — Institutional Professional Consult (permissible substitution): Payer: Medicare PPO | Admitting: Internal Medicine

## 2016-03-09 ENCOUNTER — Ambulatory Visit (HOSPITAL_BASED_OUTPATIENT_CLINIC_OR_DEPARTMENT_OTHER): Payer: Medicare PPO

## 2016-03-09 VITALS — BP 122/75 | HR 54 | Temp 97.0°F | Resp 18

## 2016-03-09 DIAGNOSIS — Z5189 Encounter for other specified aftercare: Secondary | ICD-10-CM

## 2016-03-09 DIAGNOSIS — C3432 Malignant neoplasm of lower lobe, left bronchus or lung: Secondary | ICD-10-CM | POA: Diagnosis not present

## 2016-03-09 DIAGNOSIS — C3492 Malignant neoplasm of unspecified part of left bronchus or lung: Secondary | ICD-10-CM

## 2016-03-09 MED ORDER — PEGFILGRASTIM INJECTION 6 MG/0.6ML ~~LOC~~
6.0000 mg | PREFILLED_SYRINGE | Freq: Once | SUBCUTANEOUS | Status: AC
  Administered 2016-03-09: 6 mg via SUBCUTANEOUS
  Filled 2016-03-09: qty 0.6

## 2016-03-09 NOTE — Patient Instructions (Signed)
Pegfilgrastim injection What is this medicine? PEGFILGRASTIM (PEG fil gra stim) is a long-acting granulocyte colony-stimulating factor that stimulates the growth of neutrophils, a type of white blood cell important in the body's fight against infection. It is used to reduce the incidence of fever and infection in patients with certain types of cancer who are receiving chemotherapy that affects the bone marrow, and to increase survival after being exposed to high doses of radiation. This medicine may be used for other purposes; ask your health care provider or pharmacist if you have questions. What should I tell my health care provider before I take this medicine? They need to know if you have any of these conditions: -kidney disease -latex allergy -ongoing radiation therapy -sickle cell disease -skin reactions to acrylic adhesives (On-Body Injector only) -an unusual or allergic reaction to pegfilgrastim, filgrastim, other medicines, foods, dyes, or preservatives -pregnant or trying to get pregnant -breast-feeding How should I use this medicine? This medicine is for injection under the skin. If you get this medicine at home, you will be taught how to prepare and give the pre-filled syringe or how to use the On-body Injector. Refer to the patient Instructions for Use for detailed instructions. Use exactly as directed. Take your medicine at regular intervals. Do not take your medicine more often than directed. It is important that you put your used needles and syringes in a special sharps container. Do not put them in a trash can. If you do not have a sharps container, call your pharmacist or healthcare provider to get one. Talk to your pediatrician regarding the use of this medicine in children. While this drug may be prescribed for selected conditions, precautions do apply. Overdosage: If you think you have taken too much of this medicine contact a poison control center or emergency room at  once. NOTE: This medicine is only for you. Do not share this medicine with others. What if I miss a dose? It is important not to miss your dose. Call your doctor or health care professional if you miss your dose. If you miss a dose due to an On-body Injector failure or leakage, a new dose should be administered as soon as possible using a single prefilled syringe for manual use. What may interact with this medicine? Interactions have not been studied. Give your health care provider a list of all the medicines, herbs, non-prescription drugs, or dietary supplements you use. Also tell them if you smoke, drink alcohol, or use illegal drugs. Some items may interact with your medicine. This list may not describe all possible interactions. Give your health care provider a list of all the medicines, herbs, non-prescription drugs, or dietary supplements you use. Also tell them if you smoke, drink alcohol, or use illegal drugs. Some items may interact with your medicine. What should I watch for while using this medicine? You may need blood work done while you are taking this medicine. If you are going to need a MRI, CT scan, or other procedure, tell your doctor that you are using this medicine (On-Body Injector only). What side effects may I notice from receiving this medicine? Side effects that you should report to your doctor or health care professional as soon as possible: -allergic reactions like skin rash, itching or hives, swelling of the face, lips, or tongue -dizziness -fever -pain, redness, or irritation at site where injected -pinpoint red spots on the skin -red or dark-brown urine -shortness of breath or breathing problems -stomach or side pain, or pain   at the shoulder -swelling -tiredness -trouble passing urine or change in the amount of urine Side effects that usually do not require medical attention (report to your doctor or health care professional if they continue or are  bothersome): -bone pain -muscle pain This list may not describe all possible side effects. Call your doctor for medical advice about side effects. You may report side effects to FDA at 1-800-FDA-1088. Where should I keep my medicine? Keep out of the reach of children. Store pre-filled syringes in a refrigerator between 2 and 8 degrees C (36 and 46 degrees F). Do not freeze. Keep in carton to protect from light. Throw away this medicine if it is left out of the refrigerator for more than 48 hours. Throw away any unused medicine after the expiration date. NOTE: This sheet is a summary. It may not cover all possible information. If you have questions about this medicine, talk to your doctor, pharmacist, or health care provider.    2016, Elsevier/Gold Standard. (2014-05-27 14:30:14)  

## 2016-03-14 ENCOUNTER — Other Ambulatory Visit (HOSPITAL_BASED_OUTPATIENT_CLINIC_OR_DEPARTMENT_OTHER): Payer: Medicare PPO

## 2016-03-14 ENCOUNTER — Ambulatory Visit: Payer: Medicare PPO

## 2016-03-14 DIAGNOSIS — C3492 Malignant neoplasm of unspecified part of left bronchus or lung: Secondary | ICD-10-CM

## 2016-03-14 DIAGNOSIS — C3432 Malignant neoplasm of lower lobe, left bronchus or lung: Secondary | ICD-10-CM | POA: Diagnosis not present

## 2016-03-14 LAB — COMPREHENSIVE METABOLIC PANEL
ALBUMIN: 3.7 g/dL (ref 3.5–5.0)
ALK PHOS: 106 U/L (ref 40–150)
ALT: 17 U/L (ref 0–55)
ANION GAP: 8 meq/L (ref 3–11)
AST: 10 U/L (ref 5–34)
BUN: 15.2 mg/dL (ref 7.0–26.0)
CALCIUM: 9.2 mg/dL (ref 8.4–10.4)
CHLORIDE: 102 meq/L (ref 98–109)
CO2: 31 mEq/L — ABNORMAL HIGH (ref 22–29)
CREATININE: 0.9 mg/dL (ref 0.7–1.3)
EGFR: 84 mL/min/{1.73_m2} — ABNORMAL LOW (ref >90)
Glucose: 100 mg/dl (ref 70–140)
POTASSIUM: 4.3 meq/L (ref 3.5–5.1)
Sodium: 141 mEq/L (ref 136–145)
Total Bilirubin: 0.7 mg/dL (ref 0.20–1.20)
Total Protein: 6.4 g/dL (ref 6.4–8.3)

## 2016-03-14 LAB — CBC WITH DIFFERENTIAL/PLATELET
BASO%: 0.6 % (ref 0.0–2.0)
BASOS ABS: 0 10*3/uL (ref 0.0–0.1)
EOS ABS: 0 10*3/uL (ref 0.0–0.5)
EOS%: 1.3 % (ref 0.0–7.0)
HEMATOCRIT: 40.5 % (ref 38.4–49.9)
HEMOGLOBIN: 13.2 g/dL (ref 13.0–17.1)
LYMPH#: 0.3 10*3/uL — AB (ref 0.9–3.3)
LYMPH%: 9.9 % — ABNORMAL LOW (ref 14.0–49.0)
MCH: 30.4 pg (ref 27.2–33.4)
MCHC: 32.7 g/dL (ref 32.0–36.0)
MCV: 93.1 fL (ref 79.3–98.0)
MONO#: 0.4 10*3/uL (ref 0.1–0.9)
MONO%: 11.6 % (ref 0.0–14.0)
NEUT#: 2.7 10*3/uL (ref 1.5–6.5)
NEUT%: 76.6 % — AB (ref 39.0–75.0)
PLATELETS: 101 10*3/uL — AB (ref 140–400)
RBC: 4.35 10*6/uL (ref 4.20–5.82)
RDW: 14.2 % (ref 11.0–14.6)
WBC: 3.5 10*3/uL — ABNORMAL LOW (ref 4.0–10.3)

## 2016-03-21 ENCOUNTER — Other Ambulatory Visit (HOSPITAL_BASED_OUTPATIENT_CLINIC_OR_DEPARTMENT_OTHER): Payer: Medicare PPO

## 2016-03-21 ENCOUNTER — Other Ambulatory Visit: Payer: Medicare PPO

## 2016-03-21 ENCOUNTER — Ambulatory Visit: Payer: Medicare PPO

## 2016-03-21 DIAGNOSIS — C3492 Malignant neoplasm of unspecified part of left bronchus or lung: Secondary | ICD-10-CM

## 2016-03-21 DIAGNOSIS — C3432 Malignant neoplasm of lower lobe, left bronchus or lung: Secondary | ICD-10-CM | POA: Diagnosis not present

## 2016-03-21 LAB — COMPREHENSIVE METABOLIC PANEL
ALT: 17 U/L (ref 0–55)
AST: 12 U/L (ref 5–34)
Albumin: 3.6 g/dL (ref 3.5–5.0)
Alkaline Phosphatase: 133 U/L (ref 40–150)
Anion Gap: 7 mEq/L (ref 3–11)
BUN: 8.4 mg/dL (ref 7.0–26.0)
CALCIUM: 8.7 mg/dL (ref 8.4–10.4)
CHLORIDE: 105 meq/L (ref 98–109)
CO2: 26 meq/L (ref 22–29)
CREATININE: 1 mg/dL (ref 0.7–1.3)
EGFR: 77 mL/min/{1.73_m2} — ABNORMAL LOW (ref >90)
GLUCOSE: 125 mg/dL (ref 70–140)
POTASSIUM: 4.5 meq/L (ref 3.5–5.1)
SODIUM: 138 meq/L (ref 136–145)
Total Bilirubin: 0.39 mg/dL (ref 0.20–1.20)
Total Protein: 6.3 g/dL — ABNORMAL LOW (ref 6.4–8.3)

## 2016-03-21 LAB — CBC WITH DIFFERENTIAL/PLATELET
BASO%: 0.2 % (ref 0.0–2.0)
Basophils Absolute: 0 10*3/uL (ref 0.0–0.1)
EOS%: 0.1 % (ref 0.0–7.0)
Eosinophils Absolute: 0 10*3/uL (ref 0.0–0.5)
HEMATOCRIT: 40.1 % (ref 38.4–49.9)
HGB: 13.1 g/dL (ref 13.0–17.1)
LYMPH#: 0.5 10*3/uL — AB (ref 0.9–3.3)
LYMPH%: 3.1 % — ABNORMAL LOW (ref 14.0–49.0)
MCH: 30.6 pg (ref 27.2–33.4)
MCHC: 32.7 g/dL (ref 32.0–36.0)
MCV: 93.5 fL (ref 79.3–98.0)
MONO#: 0.5 10*3/uL (ref 0.1–0.9)
MONO%: 3 % (ref 0.0–14.0)
NEUT#: 14.9 10*3/uL — ABNORMAL HIGH (ref 1.5–6.5)
NEUT%: 93.6 % — AB (ref 39.0–75.0)
Platelets: 114 10*3/uL — ABNORMAL LOW (ref 140–400)
RBC: 4.3 10*6/uL (ref 4.20–5.82)
RDW: 14.2 % (ref 11.0–14.6)
WBC: 16 10*3/uL — ABNORMAL HIGH (ref 4.0–10.3)

## 2016-03-27 ENCOUNTER — Ambulatory Visit (HOSPITAL_BASED_OUTPATIENT_CLINIC_OR_DEPARTMENT_OTHER): Payer: Medicare PPO | Admitting: Nurse Practitioner

## 2016-03-27 ENCOUNTER — Other Ambulatory Visit (HOSPITAL_BASED_OUTPATIENT_CLINIC_OR_DEPARTMENT_OTHER): Payer: Medicare PPO

## 2016-03-27 VITALS — BP 125/74 | HR 80 | Temp 98.1°F | Resp 18 | Ht 68.0 in | Wt 177.4 lb

## 2016-03-27 DIAGNOSIS — C3492 Malignant neoplasm of unspecified part of left bronchus or lung: Secondary | ICD-10-CM

## 2016-03-27 DIAGNOSIS — R0609 Other forms of dyspnea: Secondary | ICD-10-CM | POA: Diagnosis not present

## 2016-03-27 DIAGNOSIS — C3432 Malignant neoplasm of lower lobe, left bronchus or lung: Secondary | ICD-10-CM

## 2016-03-27 DIAGNOSIS — Z5111 Encounter for antineoplastic chemotherapy: Secondary | ICD-10-CM

## 2016-03-27 LAB — COMPREHENSIVE METABOLIC PANEL
ALBUMIN: 3.5 g/dL (ref 3.5–5.0)
ALK PHOS: 123 U/L (ref 40–150)
ALT: 14 U/L (ref 0–55)
AST: 12 U/L (ref 5–34)
Anion Gap: 9 mEq/L (ref 3–11)
BUN: 10.6 mg/dL (ref 7.0–26.0)
CO2: 29 mEq/L (ref 22–29)
Calcium: 8.9 mg/dL (ref 8.4–10.4)
Chloride: 102 mEq/L (ref 98–109)
Creatinine: 1 mg/dL (ref 0.7–1.3)
EGFR: 76 mL/min/{1.73_m2} — AB (ref >90)
Glucose: 102 mg/dl (ref 70–140)
POTASSIUM: 3.9 meq/L (ref 3.5–5.1)
Sodium: 140 mEq/L (ref 136–145)
Total Bilirubin: 0.31 mg/dL (ref 0.20–1.20)
Total Protein: 6.3 g/dL — ABNORMAL LOW (ref 6.4–8.3)

## 2016-03-27 LAB — CBC WITH DIFFERENTIAL/PLATELET
BASO%: 0.4 % (ref 0.0–2.0)
BASOS ABS: 0.1 10*3/uL (ref 0.0–0.1)
EOS ABS: 0 10*3/uL (ref 0.0–0.5)
EOS%: 0.3 % (ref 0.0–7.0)
HEMATOCRIT: 40 % (ref 38.4–49.9)
HEMOGLOBIN: 13.6 g/dL (ref 13.0–17.1)
LYMPH#: 0.7 10*3/uL — AB (ref 0.9–3.3)
LYMPH%: 5.1 % — ABNORMAL LOW (ref 14.0–49.0)
MCH: 31.4 pg (ref 27.2–33.4)
MCHC: 34 g/dL (ref 32.0–36.0)
MCV: 92.4 fL (ref 79.3–98.0)
MONO#: 0.7 10*3/uL (ref 0.1–0.9)
MONO%: 4.9 % (ref 0.0–14.0)
NEUT#: 12 10*3/uL — ABNORMAL HIGH (ref 1.5–6.5)
NEUT%: 89.3 % — ABNORMAL HIGH (ref 39.0–75.0)
Platelets: 119 10*3/uL — ABNORMAL LOW (ref 140–400)
RBC: 4.33 10*6/uL (ref 4.20–5.82)
RDW: 13.8 % (ref 11.0–14.6)
WBC: 13.4 10*3/uL — ABNORMAL HIGH (ref 4.0–10.3)

## 2016-03-27 MED ORDER — PROCHLORPERAZINE MALEATE 10 MG PO TABS: 10.0000 mg | ORAL_TABLET | Freq: Four times a day (QID) | ORAL | 0 refills | Status: AC | PRN

## 2016-03-27 NOTE — Progress Notes (Addendum)
[facility] OFFICE PROGRESS NOTE   DIAGNOSIS: Stage IIIB (T1b, N3, M0) non-small cell lung cancer, squamous cell carcinoma diagnosed in July 2017 presented with left lower lobe lung mass in addition to suspicious mediastinal lymphadenopathy.  PRIOR THERAPY: Concurrent chemoradiation with weekly carboplatin for AUC of 2 and paclitaxel 45 MG/M2. First dose 11/28/2015. Status post 6 cycles, last given 01/09/2016.  CURRENT THERAPY: Consolidation chemotherapy with carboplatin for AUC of 5 and paclitaxel 175 MG/M2 every 3 weeks with Neulasta support. First dose 03/07/2016.    INTERVAL HISTORY:   Mr. Latouche returns as scheduled. He completed cycle 1 consolidation chemotherapy with carboplatin/Taxol 03/07/2016. He denies nausea/vomiting. No mouth sores. No diarrhea. No numbness or tingling in his hands or feet. Stable dyspnea on exertion. No cough. No fever. He has a good appetite.  He reports he will be relocating to [facility] later this week.  Objective:  Vital signs in last 24 hours:  Blood pressure 125/74, pulse 80, temperature 98.1 F (36.7 C), temperature source Oral, resp. rate 18, height '5\' 8"'$  (1.727 m), weight 177 lb 6.4 oz (80.5 kg), SpO2 94 %.    HEENT: No thrush or ulcers. Resp: Distant breath sounds. Expiratory wheezes. Cardio: Regular rate and rhythm. GI: Abdomen soft and nontender. No hepatomegaly. Vascular: No leg edema. Calves soft and nontender.  Skin: No rash.    Lab Results:  Lab Results  Component Value Date   WBC 13.4 (H) 03/27/2016   HGB 13.6 03/27/2016   HCT 40.0 03/27/2016   MCV 92.4 03/27/2016   PLT 119 (L) 03/27/2016   NEUTROABS 12.0 (H) 03/27/2016    Imaging:  No results found.  Medications: I have reviewed the patient's current medications.  Assessment/Plan: 1. Stage IIIB (T1b, N3, M0) non-small cell lung cancer, squamous cell carcinoma diagnosed in July 2017 presented with left lower lobe lung mass in addition to suspicious  mediastinal lymphadenopathy. Status post concurrent chemotherapy/radiation with weekly carboplatin and Taxol 11/28/2015 through 01/09/2016; consolidation chemotherapy with carboplatin/Taxol every 3 weeks with 3 cycles planned; cycle 1 given 03/07/2016.   Disposition: Mr. Strole appears stable. He has completed 1 cycle of consolidation chemotherapy with carboplatin/Taxol. Plan to proceed with cycle 2 as scheduled 03/28/2016.  At today's visit he reports he will be relocating to [facility] later this week. We will make a referral to medical oncology in [place].  We did not schedule formal follow-up in our office. We will be happy to see him in the future if he returns to this area.  Patient seen with Dr. Julien Nordmann.    Ned Card ANP/GNP-BC   03/27/2016  10:18 AM  ADDENDUM: Hematology/Oncology Attending: I had a face to face encounter with the patient. I recommended his care plan. This is a very pleasant 70 years old white male with a stage IIIB non-small cell lung cancer, squamous cell carcinoma status post concurrent chemoradiation with partial response. The patient is currently undergoing consolidation chemotherapy with carboplatin and paclitaxel status post 1 cycle. He is tolerating his consolidation chemotherapy fairly well with no significant adverse effects. I recommended for the patient to proceed with cycle #2 today as scheduled. The patient is in the process of moving to [facility]. We will arrange for him to see a medical oncologist in that area to resume his care and finish the last cycle of chemotherapy followed by imaging studies for restaging of his disease. I also advised the patient that 2 cycles of consolidation chemotherapy could be good enough for him at this  point. He was advised to call immediately if he has any concerning symptoms in the interval until his appointment with the medical oncologist in [place].   Disclaimer: This note was  dictated with voice recognition software. Similar sounding words can inadvertently be transcribed and may be missed upon review. Eilleen Kempf., MD 03/27/16

## 2016-03-28 ENCOUNTER — Other Ambulatory Visit: Payer: Self-pay | Admitting: Internal Medicine

## 2016-03-28 ENCOUNTER — Ambulatory Visit (HOSPITAL_BASED_OUTPATIENT_CLINIC_OR_DEPARTMENT_OTHER): Payer: Medicare PPO

## 2016-03-28 DIAGNOSIS — Z5111 Encounter for antineoplastic chemotherapy: Secondary | ICD-10-CM | POA: Diagnosis not present

## 2016-03-28 DIAGNOSIS — C3492 Malignant neoplasm of unspecified part of left bronchus or lung: Secondary | ICD-10-CM

## 2016-03-28 DIAGNOSIS — Z23 Encounter for immunization: Secondary | ICD-10-CM

## 2016-03-28 DIAGNOSIS — C3432 Malignant neoplasm of lower lobe, left bronchus or lung: Secondary | ICD-10-CM | POA: Diagnosis not present

## 2016-03-28 MED ORDER — PACLITAXEL CHEMO INJECTION 300 MG/50ML
175.0000 mg/m2 | Freq: Once | INTRAVENOUS | Status: AC
  Administered 2016-03-28: 342 mg via INTRAVENOUS
  Filled 2016-03-28: qty 57

## 2016-03-28 MED ORDER — INFLUENZA VAC SPLIT QUAD 0.5 ML IM SUSY
0.5000 mL | PREFILLED_SYRINGE | Freq: Once | INTRAMUSCULAR | Status: AC
  Administered 2016-03-28: 0.5 mL via INTRAMUSCULAR
  Filled 2016-03-28: qty 0.5

## 2016-03-28 MED ORDER — DIPHENHYDRAMINE HCL 50 MG/ML IJ SOLN
INTRAMUSCULAR | Status: AC
  Filled 2016-03-28: qty 1

## 2016-03-28 MED ORDER — PALONOSETRON HCL INJECTION 0.25 MG/5ML
INTRAVENOUS | Status: AC
  Filled 2016-03-28: qty 5

## 2016-03-28 MED ORDER — DIPHENHYDRAMINE HCL 50 MG/ML IJ SOLN
50.0000 mg | Freq: Once | INTRAMUSCULAR | Status: AC
  Administered 2016-03-28: 50 mg via INTRAVENOUS

## 2016-03-28 MED ORDER — SODIUM CHLORIDE 0.9 % IV SOLN
Freq: Once | INTRAVENOUS | Status: AC
  Administered 2016-03-28: 10:00:00 via INTRAVENOUS

## 2016-03-28 MED ORDER — FAMOTIDINE IN NACL 20-0.9 MG/50ML-% IV SOLN
INTRAVENOUS | Status: AC
  Filled 2016-03-28: qty 50

## 2016-03-28 MED ORDER — SODIUM CHLORIDE 0.9 % IV SOLN
20.0000 mg | Freq: Once | INTRAVENOUS | Status: AC
  Administered 2016-03-28: 20 mg via INTRAVENOUS
  Filled 2016-03-28: qty 2

## 2016-03-28 MED ORDER — PALONOSETRON HCL INJECTION 0.25 MG/5ML
0.2500 mg | Freq: Once | INTRAVENOUS | Status: AC
  Administered 2016-03-28: 0.25 mg via INTRAVENOUS

## 2016-03-28 MED ORDER — FAMOTIDINE IN NACL 20-0.9 MG/50ML-% IV SOLN
20.0000 mg | Freq: Once | INTRAVENOUS | Status: AC
Start: 2016-03-28 — End: 2016-03-28
  Administered 2016-03-28: 20 mg via INTRAVENOUS

## 2016-03-28 MED ORDER — SODIUM CHLORIDE 0.9 % IV SOLN
522.0000 mg | Freq: Once | INTRAVENOUS | Status: AC
  Administered 2016-03-28: 520 mg via INTRAVENOUS
  Filled 2016-03-28: qty 52

## 2016-03-28 MED ORDER — PNEUMOCOCCAL VAC POLYVALENT 25 MCG/0.5ML IJ INJ
0.5000 mL | INJECTION | Freq: Once | INTRAMUSCULAR | Status: AC
  Administered 2016-03-28: 0.5 mL via INTRAMUSCULAR
  Filled 2016-03-28: qty 0.5

## 2016-03-28 NOTE — Patient Instructions (Signed)
[facility] Discharge Instructions for Patients Receiving Chemotherapy  Today you received the following chemotherapy agents Taxol and Carboplatin.  To help prevent nausea and vomiting after your treatment, we encourage you to take your nausea medication as prescribed.   If you develop nausea and vomiting that is not controlled by your nausea medication, call the clinic.   BELOW ARE SYMPTOMS THAT SHOULD BE REPORTED IMMEDIATELY:  *FEVER GREATER THAN 100.5 F  *CHILLS WITH OR WITHOUT FEVER  NAUSEA AND VOMITING THAT IS NOT CONTROLLED WITH YOUR NAUSEA MEDICATION  *UNUSUAL SHORTNESS OF BREATH  *UNUSUAL BRUISING OR BLEEDING  TENDERNESS IN MOUTH AND THROAT WITH OR WITHOUT PRESENCE OF ULCERS  *URINARY PROBLEMS  *BOWEL PROBLEMS  UNUSUAL RASH Items with * indicate a potential emergency and should be followed up as soon as possible.  Feel free to call the clinic you have any questions or concerns. The clinic phone number is [phone].  Please show the CHEMO ALERT CARD at check-in to the Emergency Department and triage nurse.   

## 2016-03-29 ENCOUNTER — Telehealth: Payer: Self-pay | Admitting: *Deleted

## 2016-03-29 NOTE — Telephone Encounter (Signed)
I have called the nursing home and set up injection. [facility] transportation until Monday. Notified the MD

## 2016-03-30 ENCOUNTER — Telehealth: Payer: Self-pay | Admitting: Oncology

## 2016-03-30 NOTE — Telephone Encounter (Signed)
REFERRAL MESSAGE TO HIM RE TRANSFER OF CARE PER 11/7 LOS

## 2016-04-02 ENCOUNTER — Ambulatory Visit: Payer: Medicare PPO

## 2016-04-03 ENCOUNTER — Encounter: Payer: Self-pay | Admitting: *Deleted

## 2016-04-03 NOTE — Progress Notes (Signed)
Oncology Nurse Navigator Documentation  Oncology Nurse Navigator Flowsheets 04/03/2016  Navigator Encounter Type Other/I called [facility] to check on patient's referral.  I left my name and phone number to follow up with me on referral.   Treatment Phase Other  Acuity Level 2  Acuity Level 2 Other  Time Spent with Patient 15

## 2016-04-10 ENCOUNTER — Telehealth: Payer: Self-pay | Admitting: Oncology

## 2016-04-10 NOTE — Telephone Encounter (Signed)
Faxed pt records to [facility] [phone]. A Dr. Aletha Halim review the records and the office will call pt with a appt.

## 2016-04-11 ENCOUNTER — Encounter: Payer: Self-pay | Admitting: *Deleted

## 2016-04-11 NOTE — Progress Notes (Signed)
Oncology Nurse Navigator Documentation  Oncology Nurse Navigator Flowsheets 04/11/2016  Navigator Encounter Type Other/I called [facility] to check on referral.  They have received referral but patient is not scheduled yet.  I was ask to call back to check with new patient coordinator.    Treatment Phase Follow-up  Acuity Level 2  Acuity Level 2 Assistance expediting appointments  Time Spent with Patient 30

## 2016-04-16 ENCOUNTER — Telehealth: Payer: Self-pay | Admitting: Internal Medicine

## 2016-04-16 ENCOUNTER — Encounter: Payer: Self-pay | Admitting: *Deleted

## 2016-04-16 NOTE — Progress Notes (Signed)
Oncology Nurse Navigator Documentation  Oncology Nurse Navigator Flowsheets 04/16/2016  Navigator Encounter Type Other/I called [facility] to follow up on Mr. Weese referral.  I left vm message on coordinators phone, [facility].  I asked that she call me back with my name and phone number   Treatment Phase Follow-up  Acuity Level 1  Time Spent with Patient 30

## 2016-04-16 NOTE — Telephone Encounter (Signed)
FAXED PT RECORDS TO [facility] FAX [phone]

## 2016-05-21 DEATH — deceased

## 2016-05-31 ENCOUNTER — Encounter: Payer: Self-pay | Admitting: *Deleted

## 2016-05-31 ENCOUNTER — Telehealth: Payer: Self-pay | Admitting: *Deleted

## 2016-05-31 NOTE — Progress Notes (Signed)
Oncology Nurse Navigator Documentation  Oncology Nurse Navigator Flowsheets 05/31/2016  Navigator Encounter Type Other;Letter/Fax/Email/I sent patient a letter and requested him to get in touch with the cancer center for follow up.   Treatment Phase Follow-up  Acuity Level 2  Time Spent with Patient 30

## 2016-05-31 NOTE — Telephone Encounter (Signed)
Oncology Nurse Navigator Documentation  Oncology Nurse Navigator Flowsheets 05/31/2016  Navigator Encounter Type Other/[facility] to see about [facility].  I was told that they did receive referral but the daughter called them and stated he is getting treatment in [place].  I looked up her phone number and there is no number.  I called patient's old living facility but there in no answer.  This facility closed recently.  I do not know where [facility] resides and am unable to get in touch with him.  I will update Dr. Julien Nordmann.   Treatment Phase Follow-up  Acuity Level 1  Time Spent with Patient 15

## 2016-05-31 NOTE — Progress Notes (Signed)
Oncology Nurse Navigator Documentation  Oncology Nurse Navigator Flowsheets 05/31/2016  Navigator Encounter Type Other/I called [facility] to see if they have seen Mr. Labelle. I left my name and phone number to call me with an update.   Treatment Phase Follow-up  Acuity Level 1  Time Spent with Patient 15

## 2017-06-25 IMAGING — CT CT CHEST W/O CM
3 of 4 series · 16 of 30 positions shown, 18 images · non-contrast
Comparison: 10/06/2015 and 09/26/2015

CLINICAL DATA: Follow-up lung mass

EXAM:
CT CHEST WITHOUT CONTRAST
TECHNIQUE: Multidetector CT imaging of the chest was performed following the
standard protocol without IV contrast.

[Series 3: chest w/o · axial · non-contrast · 0.74mm/px · z∈[-254,+8]mm · 7 of 141 slices shown, 9 images]
[im 18/141  mediastinal]
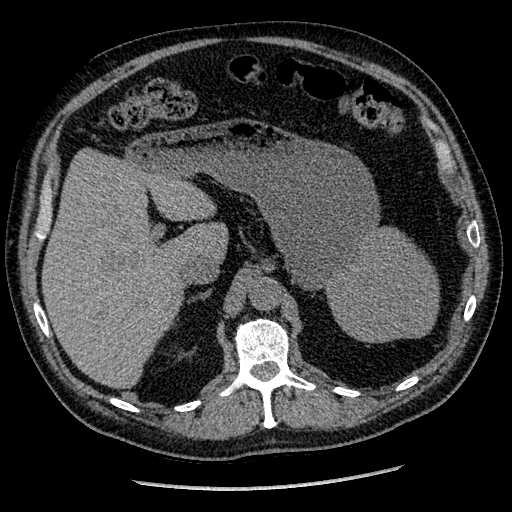
[im 18/141  lung]
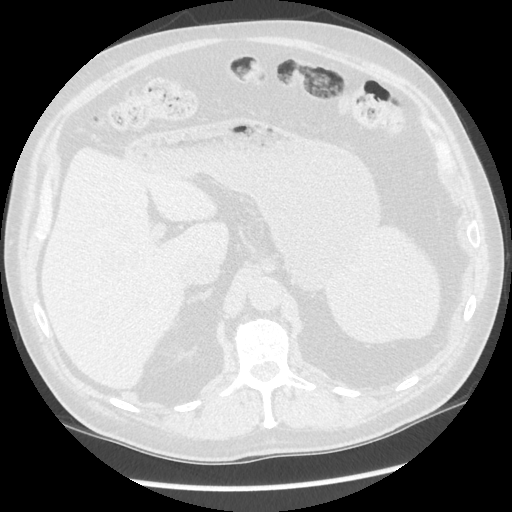
[im 36/141  lung]
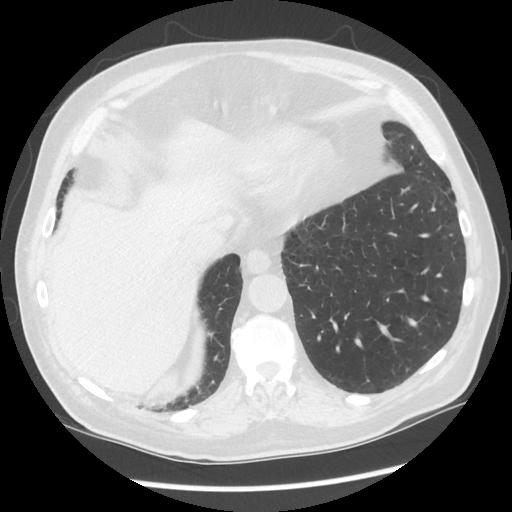
[im 53/141  lung]
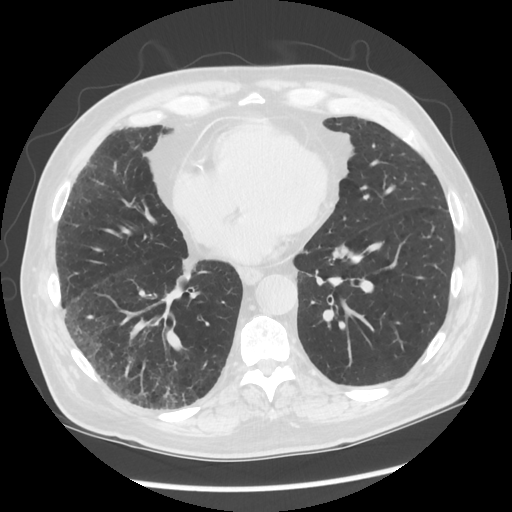
[im 71/141  lung]
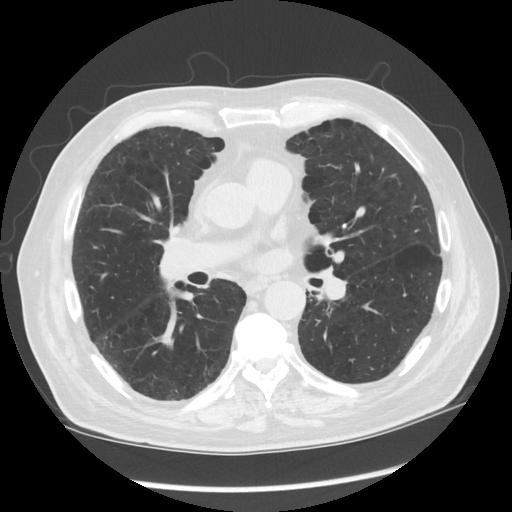
[im 88/141  mediastinal]
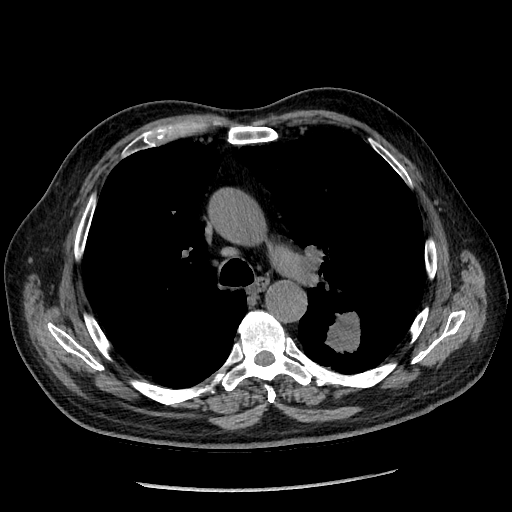
[im 88/141  lung]
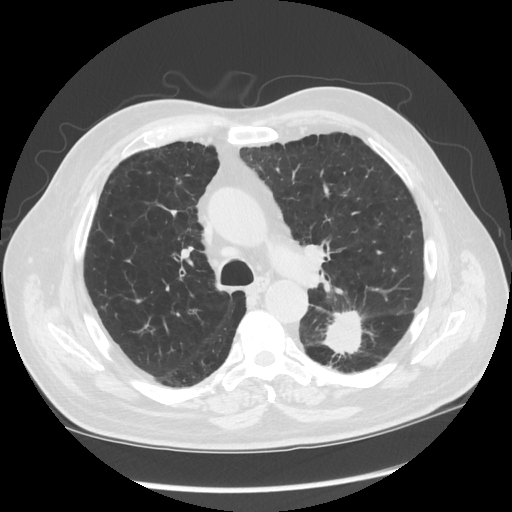
[im 106/141  lung]
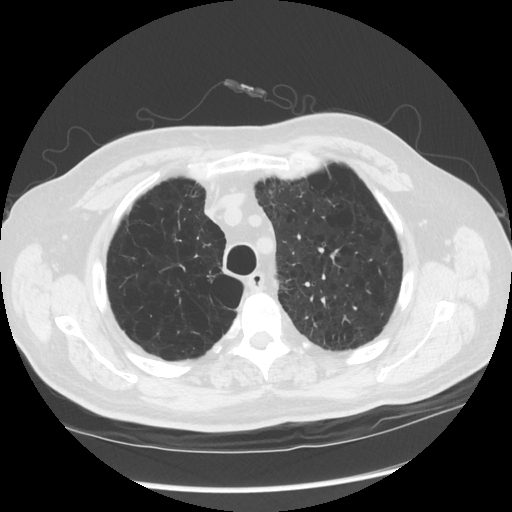
[im 123/141  lung]
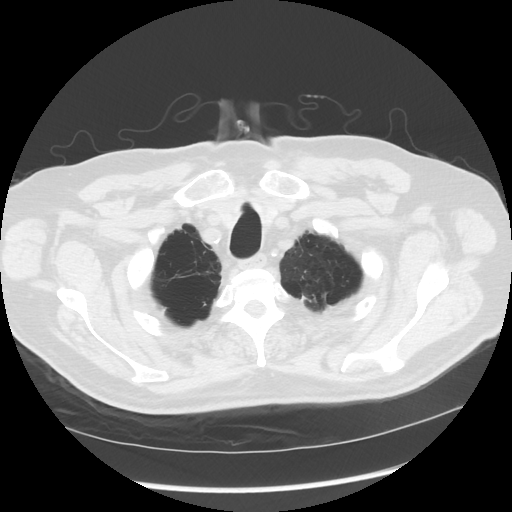

[Series 4: lung windows · axial · 0.74mm/px · z∈[-254,+8]mm · 7 of 141 slices shown]
[im 18/141  lung]
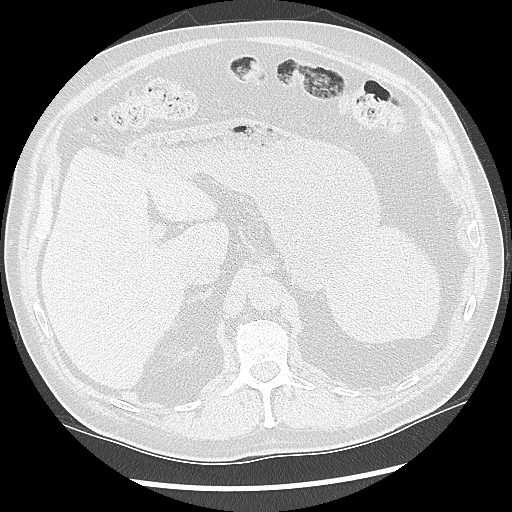
[im 36/141  lung]
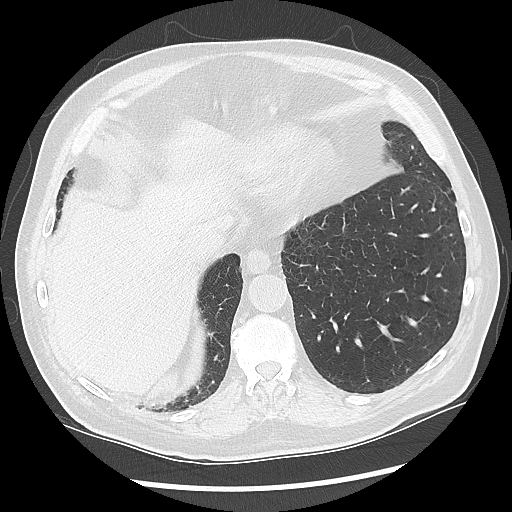
[im 53/141  lung]
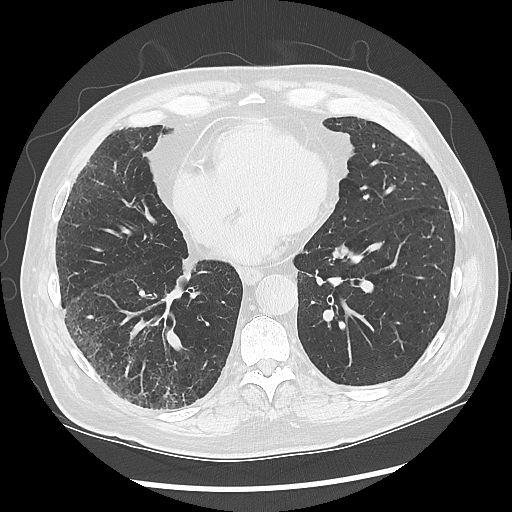
[im 71/141  lung]
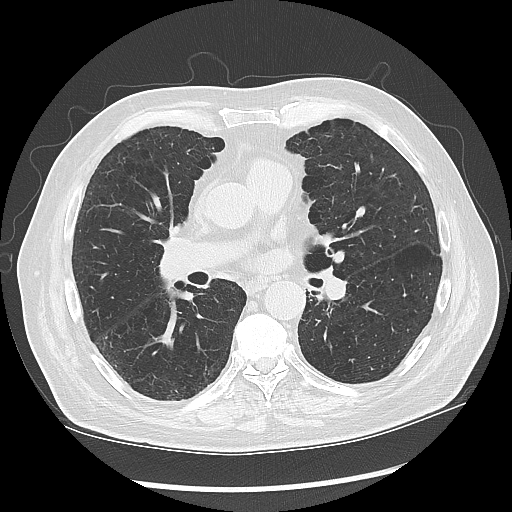
[im 88/141  lung]
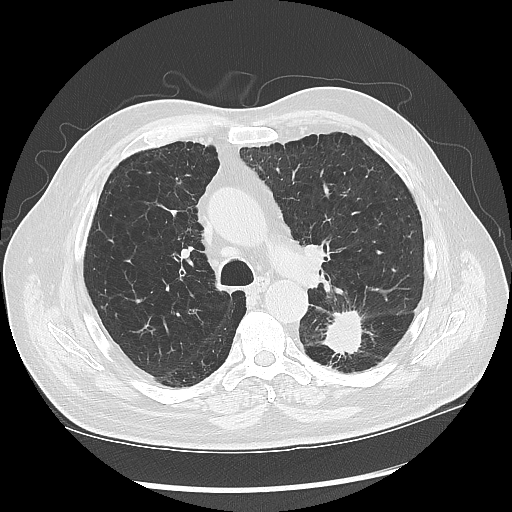
[im 106/141  lung]
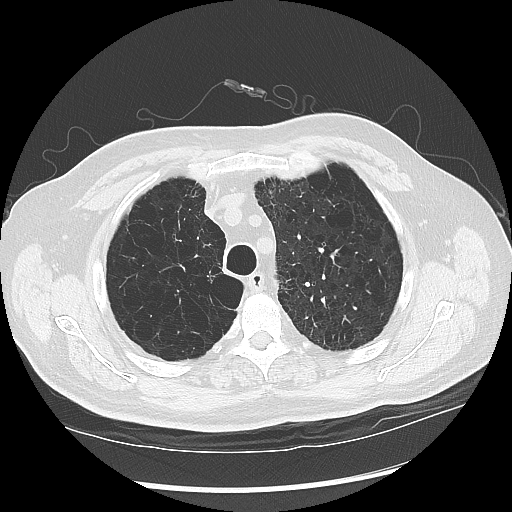
[im 123/141  lung]
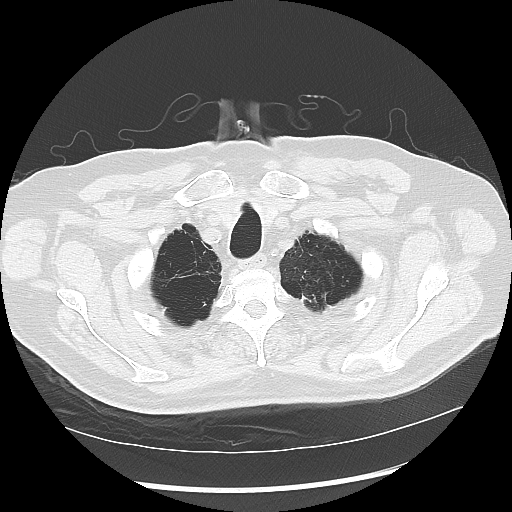

[Series 602: sagittal body · sagittal · 0.74mm/px · 2 of 152 slices shown]
[im 17/152  mediastinal]
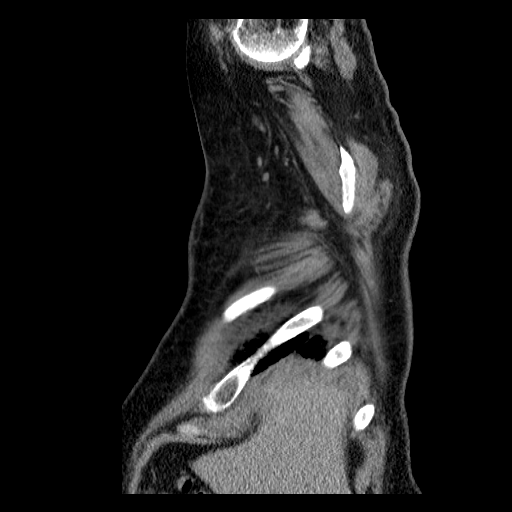
[im 34/152  mediastinal]
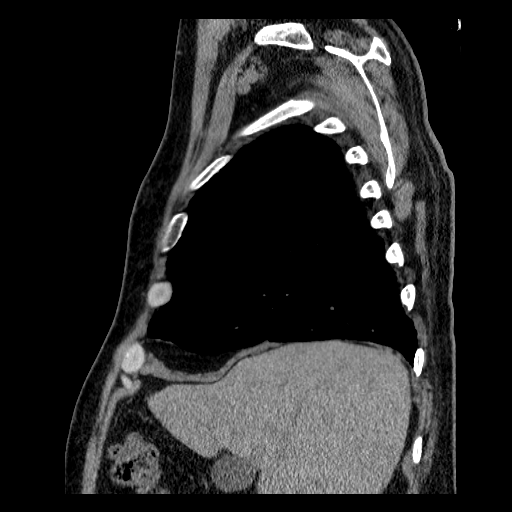

[16 of 30 positions shown; findings below may reference images not displayed]

FINDINGS: The study is limited without IV contrast. Images of the thoracic
inlet are unremarkable. Central airways are patent. A precarinal
lymph node measures 1 by 1.5 cm borderline enlarged. No significant
hilar adenopathy is noted on this unenhanced scan. Heart size within
normal limits. Atherosclerotic calcifications of coronary arteries.
Atherosclerotic calcifications of thoracic aorta.

Images of the lung parenchyma shows again extensive emphysematous
changes bilaterally. Again noted nodular spiculated mass in superior
segment of left lower lobe measures 3.7 x 2.7 cm. There is slight
progression in size from prior exam when measures 3.2 by 2.7 cm.
There is adjacent mild pleural retraction and small loculated
pleural effusion. Minimal adjacent pleural thickening. Axial image
80 there is mild central interstitial thickening in the right lower
lobe laterally. Axial image 81 there is 5 mm nodule in right middle
lobe laterally new from prior exam.

There is mild peripheral interstitial prominence in right middle
lobe and right lower lobe.

The visualized upper abdomen shows no adrenal gland mass. The
unenhanced liver shows no biliary ductal dilatation. Unenhanced
spleen is unremarkable. The visualized pancreas is atrophic. No
calcified gallstones are noted within gallbladder.

Sagittal images of the spine shows mild degenerative changes
thoracic spine. No destructive bony lesions are noted. Sagittal view
of the sternum is unremarkable. No destructive rib lesions are
identified.
IMPRESSION: 1. Again noted spiculated mass in superior segment of left lower
lobe measures 3.7 x 2.7 cm. There is minimal progression in size
from prior exam when measures 3.2 cm x 2.7 cm. There is mild
adjacent pleural retraction and minimal loculated pleural fluid and
pleural thickening. Please see axial image 50. The study is limited
without IV contrast.
2. There is indeterminate 5 mm nodule in right middle lobe new from
prior exam.
3. Again noted extensive emphysematous changes bilaterally. There is
some peripheral interstitial prominence in right middle lobe and
right lower lobe. Some central interstitial prominence with streaky
appearance in right lower lobe laterally axial image 80. Minimal
pneumonitis cannot be excluded.
4. Borderline enlarged precarinal lymph node.
5. No evidence of bony metastatic disease. No hilar adenopathy is
noted on this unenhanced scan.
